# Patient Record
Sex: Female | Born: 1975 | Race: White | Hispanic: No | Marital: Married | Smoking: Never smoker
Health system: Southern US, Community
[De-identification: ages and names within clinical notes are randomized; demographics above are authoritative.]

## PROBLEM LIST (undated history)

## (undated) DIAGNOSIS — G4700 Insomnia, unspecified: Secondary | ICD-10-CM

## (undated) DIAGNOSIS — F329 Major depressive disorder, single episode, unspecified: Secondary | ICD-10-CM

## (undated) DIAGNOSIS — K2970 Gastritis, unspecified, without bleeding: Secondary | ICD-10-CM

## (undated) DIAGNOSIS — F419 Anxiety disorder, unspecified: Secondary | ICD-10-CM

## (undated) DIAGNOSIS — Z9889 Other specified postprocedural states: Secondary | ICD-10-CM

## (undated) DIAGNOSIS — F32A Depression, unspecified: Secondary | ICD-10-CM

## (undated) DIAGNOSIS — R05 Cough: Secondary | ICD-10-CM

## (undated) DIAGNOSIS — E669 Obesity, unspecified: Secondary | ICD-10-CM

## (undated) DIAGNOSIS — D126 Benign neoplasm of colon, unspecified: Secondary | ICD-10-CM

## (undated) DIAGNOSIS — T7840XA Allergy, unspecified, initial encounter: Secondary | ICD-10-CM

## (undated) DIAGNOSIS — R059 Cough, unspecified: Secondary | ICD-10-CM

## (undated) DIAGNOSIS — K219 Gastro-esophageal reflux disease without esophagitis: Secondary | ICD-10-CM

## (undated) DIAGNOSIS — L0390 Cellulitis, unspecified: Secondary | ICD-10-CM

## (undated) DIAGNOSIS — R112 Nausea with vomiting, unspecified: Secondary | ICD-10-CM

## (undated) DIAGNOSIS — Z8739 Personal history of other diseases of the musculoskeletal system and connective tissue: Secondary | ICD-10-CM

## (undated) HISTORY — DX: Anxiety disorder, unspecified: F41.9

## (undated) HISTORY — DX: Obesity, unspecified: E66.9

## (undated) HISTORY — DX: Gastro-esophageal reflux disease without esophagitis: K21.9

## (undated) HISTORY — DX: Gastritis, unspecified, without bleeding: K29.70

## (undated) HISTORY — DX: Other specified postprocedural states: Z98.890

## (undated) HISTORY — DX: Benign neoplasm of colon, unspecified: D12.6

## (undated) HISTORY — DX: Major depressive disorder, single episode, unspecified: F32.9

## (undated) HISTORY — PX: TONSILLECTOMY: SUR1361

## (undated) HISTORY — DX: Cellulitis, unspecified: L03.90

## (undated) HISTORY — DX: Other specified postprocedural states: R11.2

## (undated) HISTORY — DX: Depression, unspecified: F32.A

## (undated) HISTORY — DX: Personal history of other diseases of the musculoskeletal system and connective tissue: Z87.39

## (undated) HISTORY — DX: Insomnia, unspecified: G47.00

## (undated) HISTORY — DX: Allergy, unspecified, initial encounter: T78.40XA

## (undated) HISTORY — DX: Cough, unspecified: R05.9

## (undated) HISTORY — PX: COLONOSCOPY W/ BIOPSIES: SHX1374

---
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
## 1998-10-16 HISTORY — PX: CHOLECYSTECTOMY: SHX55

## 1999-01-16 HISTORY — PX: BREAST REDUCTION SURGERY: SHX8

## 2009-01-30 ENCOUNTER — Ambulatory Visit (HOSPITAL_COMMUNITY): Admission: RE | Admit: 2009-01-30 | Discharge: 2009-01-30 | Payer: Self-pay | Admitting: Family Medicine

## 2010-11-03 ENCOUNTER — Other Ambulatory Visit (HOSPITAL_COMMUNITY): Payer: Self-pay | Admitting: Obstetrics and Gynecology

## 2010-11-03 DIAGNOSIS — N979 Female infertility, unspecified: Secondary | ICD-10-CM

## 2010-11-13 ENCOUNTER — Ambulatory Visit (HOSPITAL_COMMUNITY)
Admission: RE | Admit: 2010-11-13 | Discharge: 2010-11-13 | Disposition: A | Discharge status: Home / Self Care | Payer: BC Managed Care – PPO | Source: Ambulatory Visit | Attending: Obstetrics and Gynecology | Admitting: Obstetrics and Gynecology

## 2010-11-13 ENCOUNTER — Ambulatory Visit (HOSPITAL_COMMUNITY): Payer: Self-pay

## 2010-11-13 DIAGNOSIS — N979 Female infertility, unspecified: Secondary | ICD-10-CM | POA: Insufficient documentation

## 2010-11-13 MED ORDER — IOHEXOL 300 MG/ML  SOLN
25.0000 mL | Freq: Once | INTRAMUSCULAR | Status: AC | PRN
  Administered 2010-11-13: 25 mL

## 2011-01-16 HISTORY — PX: COLONOSCOPY: SHX174

## 2011-01-16 HISTORY — PX: POLYPECTOMY: SHX149

## 2011-06-04 ENCOUNTER — Other Ambulatory Visit: Payer: Self-pay | Admitting: Family Medicine

## 2011-06-04 DIAGNOSIS — M545 Low back pain: Secondary | ICD-10-CM

## 2011-06-06 ENCOUNTER — Inpatient Hospital Stay: Admission: RE | Admit: 2011-06-06 | Payer: BC Managed Care – PPO | Source: Ambulatory Visit

## 2011-09-27 ENCOUNTER — Other Ambulatory Visit: Payer: Self-pay | Admitting: Family Medicine

## 2011-09-27 DIAGNOSIS — R634 Abnormal weight loss: Secondary | ICD-10-CM

## 2011-10-02 ENCOUNTER — Ambulatory Visit (HOSPITAL_COMMUNITY)
Admission: RE | Admit: 2011-10-02 | Discharge: 2011-10-02 | Disposition: A | Discharge status: Home / Self Care | Payer: BC Managed Care – PPO | Source: Ambulatory Visit | Attending: Family Medicine | Admitting: Family Medicine

## 2011-10-02 DIAGNOSIS — R197 Diarrhea, unspecified: Secondary | ICD-10-CM | POA: Insufficient documentation

## 2011-10-02 DIAGNOSIS — R935 Abnormal findings on diagnostic imaging of other abdominal regions, including retroperitoneum: Secondary | ICD-10-CM | POA: Insufficient documentation

## 2011-10-02 DIAGNOSIS — R634 Abnormal weight loss: Secondary | ICD-10-CM

## 2011-10-02 DIAGNOSIS — R110 Nausea: Secondary | ICD-10-CM | POA: Insufficient documentation

## 2011-10-02 MED ORDER — IOHEXOL 300 MG/ML  SOLN
100.0000 mL | Freq: Once | INTRAMUSCULAR | Status: AC | PRN
  Administered 2011-10-02: 100 mL via INTRAVENOUS

## 2011-10-08 ENCOUNTER — Other Ambulatory Visit: Payer: Self-pay | Admitting: Family Medicine

## 2011-10-08 DIAGNOSIS — K769 Liver disease, unspecified: Secondary | ICD-10-CM

## 2011-10-10 ENCOUNTER — Ambulatory Visit (HOSPITAL_COMMUNITY)
Admission: RE | Admit: 2011-10-10 | Discharge: 2011-10-10 | Disposition: A | Discharge status: Home / Self Care | Payer: BC Managed Care – PPO | Source: Ambulatory Visit | Attending: Family Medicine | Admitting: Family Medicine

## 2011-10-10 ENCOUNTER — Other Ambulatory Visit: Payer: Self-pay | Admitting: Family Medicine

## 2011-10-10 DIAGNOSIS — K7689 Other specified diseases of liver: Secondary | ICD-10-CM | POA: Insufficient documentation

## 2011-10-10 DIAGNOSIS — K769 Liver disease, unspecified: Secondary | ICD-10-CM

## 2011-10-10 MED ORDER — GADOBENATE DIMEGLUMINE 529 MG/ML IV SOLN
20.0000 mL | Freq: Once | INTRAVENOUS | Status: AC | PRN
  Administered 2011-10-10: 20 mL via INTRAVENOUS

## 2011-11-19 ENCOUNTER — Encounter: Payer: Self-pay | Admitting: Internal Medicine

## 2011-11-22 ENCOUNTER — Encounter: Payer: Self-pay | Admitting: Internal Medicine

## 2011-11-23 ENCOUNTER — Ambulatory Visit (INDEPENDENT_AMBULATORY_CARE_PROVIDER_SITE_OTHER): Payer: BC Managed Care – PPO | Admitting: Internal Medicine

## 2011-11-23 ENCOUNTER — Encounter: Payer: Self-pay | Admitting: Internal Medicine

## 2011-11-23 ENCOUNTER — Other Ambulatory Visit (INDEPENDENT_AMBULATORY_CARE_PROVIDER_SITE_OTHER): Payer: BC Managed Care – PPO

## 2011-11-23 VITALS — BP 118/90 | HR 112 | Ht 62.0 in | Wt 227.0 lb

## 2011-11-23 DIAGNOSIS — F419 Anxiety disorder, unspecified: Secondary | ICD-10-CM | POA: Insufficient documentation

## 2011-11-23 DIAGNOSIS — E669 Obesity, unspecified: Secondary | ICD-10-CM | POA: Insufficient documentation

## 2011-11-23 DIAGNOSIS — R634 Abnormal weight loss: Secondary | ICD-10-CM

## 2011-11-23 DIAGNOSIS — R112 Nausea with vomiting, unspecified: Secondary | ICD-10-CM

## 2011-11-23 DIAGNOSIS — R195 Other fecal abnormalities: Secondary | ICD-10-CM

## 2011-11-23 DIAGNOSIS — F329 Major depressive disorder, single episode, unspecified: Secondary | ICD-10-CM | POA: Insufficient documentation

## 2011-11-23 DIAGNOSIS — R109 Unspecified abdominal pain: Secondary | ICD-10-CM

## 2011-11-23 LAB — COMPREHENSIVE METABOLIC PANEL
ALT: 16 U/L (ref 0–35)
AST: 19 U/L (ref 0–37)
Alkaline Phosphatase: 54 U/L (ref 39–117)
BUN: 9 mg/dL (ref 6–23)
Creatinine, Ser: 0.7 mg/dL (ref 0.4–1.2)
Total Bilirubin: 0.5 mg/dL (ref 0.3–1.2)

## 2011-11-23 LAB — CBC
HCT: 45.3 % (ref 36.0–46.0)
Hemoglobin: 15 g/dL (ref 12.0–15.0)
WBC: 10.8 10*3/uL — ABNORMAL HIGH (ref 4.5–10.5)

## 2011-11-23 LAB — IGA: IgA: 228 mg/dL (ref 68–378)

## 2011-11-23 LAB — TSH: TSH: 1.71 u[IU]/mL (ref 0.35–5.50)

## 2011-11-23 MED ORDER — PEG-KCL-NACL-NASULF-NA ASC-C 100 G PO SOLR: 1.0000 | Freq: Once | ORAL | Status: DC

## 2011-11-23 NOTE — Progress Notes (Signed)
Patient ID: Ruth Rivers, female   DOB: 15-Oct-1975, 36 y.o.   MRN: [id number]  SUBJECTIVE: HPI Mrs. Plate is a 36 yo female with PMH of anxiety and depression and obesity who seen in consultation at the request of Dr. Christell Constant for evaluation of weight loss and abdominal complaints. The patient reports that she has lost 75 pounds since late May early June of this year. It was mid July before she noticed her weight loss. Within last 3 or 4 weeks she's also developed significant nausea which is worse after eating along with vomiting. She reports she is often unable to keep down her food secondary to vomiting. She has also noted diffuse abdominal pain also worse after eating. Her bowel habits have changed and she is developed loose stools. She's not having stool every day, but when she is able to keep food down she normally has loose stools. She denies blood or melena in her stool. She's had some nocturnal heartburn but no dysphagia or odynophagia. He's also noted cervical neck pain and elbow pain.  She has intermittently had rash which he describes like "hives" the last within the past week around her umbilicus which resolved with hydrocortisone cream. No known fevers or chills. No new medications other than Nexium 40 mg daily started in the last week which has not seemed to help. She's also using Phenergan and Tylenol #3 for nausea and pain respectively. She has been on venlafaxine and Klonopin when necessary for years.  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  She has a maternal and who had colon cancer around age 4 but no first degree relatives with CRC  Review of Systems  As per history of present illness, otherwise negative   Past Medical History  Diagnosis Date  . Cellulitis   . Depression   . Insomnia   . Anxiety   . Obesity     Current Outpatient Prescriptions  Medication Sig Dispense Refill  . acetaminophen-codeine (TYLENOL #3) 300-30 MG per tablet Take 1 tablet by  mouth every 4 (four) hours as needed.      . clonazePAM (KLONOPIN) 1 MG tablet Take 1 mg by mouth as needed.      Marland Kitchen esomeprazole (NEXIUM) 40 MG capsule Take 40 mg by mouth daily before breakfast.      . promethazine (PHENERGAN) 25 MG tablet Take 25 mg by mouth every 6 (six) hours as needed.      . venlafaxine XR (EFFEXOR-XR) 75 MG 24 hr capsule Take 375 mg by mouth daily.        Allergies  Allergen Reactions  . Penicillins Rash    Family History  Problem Relation Age of Onset  . Breast cancer Other     PGGM  . Colon cancer Maternal Aunt   . Colon polyps Father   . Other Cousin     Non-Hodgkins lymphoma    History  Substance Use Topics  . Smoking status: Never Smoker   . Smokeless tobacco: Never Used  . Alcohol Use: No    OBJECTIVE: BP 118/90  Pulse 112  Ht 5\' 2"  (1.575 m)  Wt 227 lb (102.967 kg)  BMI 41.52 kg/m2  LMP 11/06/2011 Constitutional: Well-developed and well-nourished. No distress. HEENT: Normocephalic and atraumatic. Oropharynx is clear and moist. No oropharyngeal exudate. Conjunctivae are normal. No scleral icterus. Cardiovascular: Normal rate, regular rhythm and intact distal pulses. No M/R/G Pulmonary/chest: Effort normal and breath sounds normal. No wheezing, rales or rhonchi. Abdominal: Soft, mild  diffuse tenderness without rebound or guarding, obese, nondistended. Bowel sounds active throughout. There are no masses palpable. No hepatosplenomegaly. Extremities: no clubbing, cyanosis, or edema Neurological: Alert and oriented to person place and time. Skin: Skin is warm and dry. No rashes noted. Psychiatric: Normal mood and affect. Behavior is normal.  Labs and Imaging -- Fecal WBC - negative C diff - negative ESR 8 H Pylori IgG - negative  MRI abd 10/10/11 -- Impression: 1. Focal fatty deposition identified within the medial segment of the left hepatic lobe corresponds to the hypodense structure noted on recent CT. 2.  Prior cholecystectomy  CT  abdomen 10/02/2011 -- impression: 1. No acute abnormality. 2. Low density lesion in the right hepatic lobe adjacent to the falciform ligament likely represents benign hemangioma. 3. Cholecystectomy  ASSESSMENT AND PLAN: 36 yo female with PMH of anxiety and depression and obesity who seen in consultation at the request of Dr. Christell Constant for evaluation of weight loss and abdominal complaints  1.  Profound weight loss/nausea/vomiting/abdominal pain -- the patient has had an impressive weight loss approximately 20% of her body weight and this is yet to be explained. She has recently developed GI complaints in this warrants further investigation. I recommended upper endoscopy and colonoscopy both to evaluate for inflammation or other causes to explain her weight loss. She did have 2 stool studies performed recently, but I would like to check an ova and parasite exam as well. I will check a celiac panel. Will plan small bowel biopsies to evaluate for a malabsorptive process. She will continue with Nexium daily for now along with Phenergan as needed for nausea.  Further recommendations can be made after endoscopies

## 2011-11-23 NOTE — Patient Instructions (Addendum)
You have been scheduled for a colonoscopy/endoscopy with propofol. Please follow written instructions given to you at your visit today.  Please pick up your prep kit at the pharmacy within the next 1-3 days. If you use inhalers (even only as needed) or a CPAP machine, please bring them with you on the day of your procedure.  Your physician has requested that you go to the basement for the following lab work before leaving today: CBC, CMP, TSH, Celiac  We have sent the following medications to your pharmacy for you to pick up at your convenience: Moviprep; you were given instructions today at your office visit

## 2011-11-27 ENCOUNTER — Telehealth: Payer: Self-pay | Admitting: Internal Medicine

## 2011-11-27 LAB — TISSUE TRANSGLUTAMINASE, IGA: Tissue Transglutaminase Ab, IgA: 8 U/mL (ref <20)

## 2011-11-27 MED ORDER — ONDANSETRON HCL 4 MG PO TABS: ORAL_TABLET | ORAL | Status: DC

## 2011-11-27 MED ORDER — HYDROCODONE-ACETAMINOPHEN 5-500 MG PO TABS: ORAL_TABLET | ORAL | Status: DC

## 2011-11-27 NOTE — Telephone Encounter (Signed)
Pt reports her nausea is worse as well as her pain in the middle of her abdomen just below the waist. She states the Phenergan isn't helping nor is the Tylenol #3; states she can't keep anything down. I cannot move up her ECL d/t no space. Will call her back.   Spoke with Mike Gip, PA who recommended Zofran for nausea prn and Vicodin for pain, but she must stop the Tylenol #3. Informed pt of med changes and that I will send this to Dr Rhea Belton and will call her back tomorrow. Pt stated understanding.

## 2011-11-27 NOTE — Telephone Encounter (Signed)
lmom for pt to call back. Pt seen on 11/23/11 for weight loss, abdominal pain, n&v. Pt is scheduled for an ECL on 12/03/11.

## 2011-11-28 ENCOUNTER — Telehealth: Payer: Self-pay | Admitting: Internal Medicine

## 2011-11-28 NOTE — Telephone Encounter (Signed)
Agree with the new meds assuming she stops Tylenol #3 Her procedure is early next week.  Please ask that she keep Ruth Rivers updated on how she is doing

## 2011-11-28 NOTE — Telephone Encounter (Signed)
Called in script again; notified pt.

## 2011-11-28 NOTE — Telephone Encounter (Signed)
Informed pt of Dr Lauro Franklin recommendations on 11/28/11 encounter.

## 2011-12-03 ENCOUNTER — Other Ambulatory Visit: Payer: BC Managed Care – PPO

## 2011-12-03 ENCOUNTER — Encounter: Payer: Self-pay | Admitting: Internal Medicine

## 2011-12-03 ENCOUNTER — Ambulatory Visit (AMBULATORY_SURGERY_CENTER): Payer: BC Managed Care – PPO | Admitting: Internal Medicine

## 2011-12-03 VITALS — BP 108/73 | HR 67 | Temp 100.2°F | Resp 14 | Ht 62.0 in | Wt 227.0 lb

## 2011-12-03 DIAGNOSIS — K635 Polyp of colon: Secondary | ICD-10-CM

## 2011-12-03 DIAGNOSIS — R634 Abnormal weight loss: Secondary | ICD-10-CM

## 2011-12-03 DIAGNOSIS — K2970 Gastritis, unspecified, without bleeding: Secondary | ICD-10-CM

## 2011-12-03 DIAGNOSIS — D126 Benign neoplasm of colon, unspecified: Secondary | ICD-10-CM

## 2011-12-03 DIAGNOSIS — R109 Unspecified abdominal pain: Secondary | ICD-10-CM

## 2011-12-03 DIAGNOSIS — R112 Nausea with vomiting, unspecified: Secondary | ICD-10-CM

## 2011-12-03 DIAGNOSIS — D1330 Benign neoplasm of unspecified part of small intestine: Secondary | ICD-10-CM

## 2011-12-03 DIAGNOSIS — K2960 Other gastritis without bleeding: Secondary | ICD-10-CM

## 2011-12-03 MED ORDER — SODIUM CHLORIDE 0.9 % IV SOLN: 500.0000 mL | INTRAVENOUS | Status: DC

## 2011-12-03 MED ORDER — SUCRALFATE 1 GM/10ML PO SUSP: 1.0000 g | Freq: Four times a day (QID) | ORAL | Status: DC

## 2011-12-03 MED ORDER — ESOMEPRAZOLE MAGNESIUM 40 MG PO CPDR: 40.0000 mg | DELAYED_RELEASE_CAPSULE | Freq: Two times a day (BID) | ORAL | Status: DC

## 2011-12-03 NOTE — Progress Notes (Signed)
Pressure applied to the abdomen to reach the cecum 

## 2011-12-03 NOTE — Progress Notes (Signed)
Patient did not experience any of the following events: a burn prior to discharge; a fall within the facility; wrong site/side/patient/procedure/implant event; or a hospital transfer or hospital admission upon discharge from the facility. (G8907) Patient did not have preoperative order for IV antibiotic SSI prophylaxis. (G8918)  

## 2011-12-03 NOTE — Op Note (Signed)
[facility] [address] [place], [address]   COLONOSCOPY PROCEDURE REPORT  PATIENT: Jacquita, Mulhearn  MR#: [id number] BIRTHDATE: 1975/05/06 , 36  yrs. old GENDER: Female ENDOSCOPIST: Beverley Fiedler, MD REFERRED XB:JYNWG, Donald PROCEDURE DATE:  12/03/2011 PROCEDURE:   Colonoscopy with snare polypectomy ASA CLASS:   Class III INDICATIONS:Weight loss, Nausea, Vomiting, and Change in bowel habits. MEDICATIONS: MAC sedation, administered by CRNA and propofol (Diprivan) 400mg  IV  DESCRIPTION OF PROCEDURE:   After the risks benefits and alternatives of the procedure were thoroughly explained, informed consent was obtained.  A digital rectal exam revealed no rectal mass.   The LB [id number]-H180AL [id number]  endoscope was introduced through the anus and advanced to the terminal ileum which was intubated for a short distance. No adverse events experienced.   The quality of the prep was good, using MoviPrep  The instrument was then slowly withdrawn as the colon was fully examined.   COLON FINDINGS: The mucosa appeared normal in the terminal ileum. A sessile polyp measuring 3 mm in size was found in the transverse colon.  A polypectomy was performed with a cold snare.  The resection was complete and the polyp tissue was completely retrieved.   A sessile polyp measuring 10 mm in size was found in the transverse colon.  A polypectomy was performed using snare cautery.  The resection was complete and the polyp tissue was completely retrieved.   Mild erythema (nonspecific) was found in the sigmoid colon.  This erythema was patchy over approximately 10 cm.  Multiple biopsies of the area were performed using cold forceps.  Retroflexed views revealed no abnormalities. The time to cecum=7 minutes 14 seconds.  Withdrawal time=22 minutes 48 seconds. The scope was withdrawn and the procedure completed.  COMPLICATIONS: There were no complications.  ENDOSCOPIC IMPRESSION: 1.   Normal  mucosa in the terminal ileum 2.   Sessile polyp measuring 3 mm in size was found in the transverse colon; polypectomy was performed with a cold snare 3.   Sessile polyp measuring 10 mm in size was found in the transverse colon; polypectomy was performed using snare cautery 4.   Mild erythematous was found in the sigmoid colon; multiple biopsies of the area were performed using cold forceps  RECOMMENDATIONS: 1.  Await pathology results 2.  Timing of repeat colonoscopy will be determined by pathology findings. 3.  You will receive a letter within 1-2 weeks with the results of your biopsy as well as final recommendations.  Please call my office if you have not received a letter after 3 weeks.   eSigned:  Beverley Fiedler, MD 12/03/2011 3:58 PM   cc: Rudi Heap, MD and The Patient   PATIENT NAME:  Frankie, Zito MR#: [id number]

## 2011-12-03 NOTE — Patient Instructions (Addendum)

## 2011-12-03 NOTE — Op Note (Signed)
[facility] [address] [place], [address]   ENDOSCOPY PROCEDURE REPORT  PATIENT: [name], [name]  MR#: [id number] BIRTHDATE: 1975/07/20 , 36  yrs. old GENDER: Female ENDOSCOPIST: Beverley Fiedler, MD REFERRED BY:  Rudi Heap PROCEDURE DATE:  12/03/2011 PROCEDURE:  EGD w/ biopsy ASA CLASS:     Class III INDICATIONS:  Nausea.   Vomiting.   Weight loss.   abdominal pain. MEDICATIONS: MAC sedation, administered by CRNA and propofol (Diprivan) 200mg  IV TOPICAL ANESTHETIC: Cetacaine Spray  DESCRIPTION OF PROCEDURE: After the risks benefits and alternatives of the procedure were thoroughly explained, informed consent was obtained.  The LB GIF-H180 [id number] endoscope was introduced through the mouth and advanced to the second portion of the duodenum. Without limitations.  The instrument was slowly withdrawn as the mucosa was fully examined.      ESOPHAGUS: There was LA Class B esophagitis noted.   The esophagus was otherwise normal.  STOMACH: Moderate acute gastritis (inflammation) with hemorrhage was found in the gastric body and gastric antrum.  Biopsies were taken in the antrum and angularis.  DUODENUM: The duodenal mucosa showed no abnormalities in the bulb and second portion of the duodenum.  Cold forceps biopsies were taken in the bulb and second portion.  Retroflexed views revealed no abnormalities.     The scope was then withdrawn from the patient and the procedure completed.  COMPLICATIONS: There were no complications.  ENDOSCOPIC IMPRESSION: 1.   There was LA Class B esophagitis noted 2.   The esophagus was otherwise normal. 3.   Acute gastritis (inflammation) with hemorrhage was found in the gastric body and gastric antrum; biopsies were taken in the antrum and angularis 4.   The duodenal mucosa showed no abnormalities in the bulb and second portion of the duodenum     RECOMMENDATIONS: 1.  Await pathology results 2.  Avoid NSAIDS 3.   Follow-up of helicobacter pylori status, treat if indicated 4.  Would increase Nexium to 40 mg twice daily until pathology results available  eSigned:  Beverley Fiedler, MD 12/03/2011 3:52 PM   XB:JYNWGN Christell Constant, MD and The Patient  PATIENT NAME:  [name], [name] MR#: [id number]

## 2011-12-04 ENCOUNTER — Telehealth: Payer: Self-pay | Admitting: *Deleted

## 2011-12-04 LAB — GASTRIN, SERUM: Gastrin: 10 pg/mL (ref 0–115)

## 2011-12-04 NOTE — Telephone Encounter (Signed)
  Follow up Call-  Call back number 12/03/2011  Post procedure Call Back phone  # [phone]  Permission to leave phone message Yes     Patient questions:  Do you have a fever, pain , or abdominal swelling? yes Pain Score  7 *  Have you tolerated food without any problems? no  Have you been able to return to your normal activities? yes  Do you have any questions about your discharge instructions: Diet   no Medications  no Follow up visit  no  Do you have questions or concerns about your Care? no  Actions: * If pain score is 4 or above: Physician/ provider Notified : Erick Blinks, MD - pt c/o ABD pain 7/10 and nausea, pt states this is not new had both pain and nausea before procedure, reviewed increase in nexium and avoiding NSAIDS, encouraged pt to call back if pain and nausea does not subside. This message will be routed to Dr. Rhea Belton

## 2011-12-04 NOTE — Telephone Encounter (Signed)
Patient's pain is ongoing, we have increased her Nexium and added sucralfate. Hopefully we'll have more formation soon when biopsy results are available

## 2011-12-06 ENCOUNTER — Other Ambulatory Visit: Payer: Self-pay | Admitting: Gastroenterology

## 2011-12-06 ENCOUNTER — Telehealth: Payer: Self-pay | Admitting: Internal Medicine

## 2011-12-06 DIAGNOSIS — R509 Fever, unspecified: Secondary | ICD-10-CM

## 2011-12-06 MED ORDER — HYDROCODONE-ACETAMINOPHEN 5-500 MG PO TABS: ORAL_TABLET | ORAL | Status: DC

## 2011-12-06 NOTE — Telephone Encounter (Signed)
Patient reports that she is having continued abdominal pain.  This is the same pain she had prior to colon/endo on 12/03/11.  She reports this am temp 102.3  she did take Tylenol and about 1 hour ago it was 100.2.  Discussed with Dr. Rhea Belton.  She should come for a UA and CXR for fever of unknown origin.  He does not believe this is procedure related.  He also ordered that the patient contact Dr. Kathi Der office and advise about fever.  She denies any other new symptoms, sick contacts, cough, congestion, etc.  She is advised we will contact her with bx results, but Dr. Rhea Belton did not think the bx results would explain fever. She verbalized understanding.

## 2011-12-07 ENCOUNTER — Ambulatory Visit (INDEPENDENT_AMBULATORY_CARE_PROVIDER_SITE_OTHER)
Admission: RE | Admit: 2011-12-07 | Discharge: 2011-12-07 | Disposition: A | Discharge status: Home / Self Care | Payer: BC Managed Care – PPO | Source: Ambulatory Visit | Attending: Internal Medicine | Admitting: Internal Medicine

## 2011-12-07 ENCOUNTER — Other Ambulatory Visit (INDEPENDENT_AMBULATORY_CARE_PROVIDER_SITE_OTHER): Payer: BC Managed Care – PPO

## 2011-12-07 DIAGNOSIS — R509 Fever, unspecified: Secondary | ICD-10-CM

## 2011-12-07 LAB — URINALYSIS, ROUTINE W REFLEX MICROSCOPIC
Bilirubin Urine: NEGATIVE
Hgb urine dipstick: NEGATIVE
Leukocytes, UA: NEGATIVE
Nitrite: NEGATIVE

## 2011-12-12 ENCOUNTER — Encounter: Payer: Self-pay | Admitting: Internal Medicine

## 2011-12-12 ENCOUNTER — Telehealth: Payer: Self-pay | Admitting: *Deleted

## 2011-12-12 NOTE — Telephone Encounter (Signed)
Informed pt of Dr Lauro Franklin findings and recommendations that she discuss this further with Dr Christell Constant and return here for an OV on 01/02/12.

## 2011-12-12 NOTE — Telephone Encounter (Signed)
Message copied by Florene Glen on Wed Dec 12, 2011  2:36 PM ------      Message from: Beverley Fiedler      Created: Wed Dec 12, 2011  1:19 PM       Small bowel biopsies were normal      Gastric biopsies revealed minimal chronic, not active, inflammation with NO H. Pylori      2 colon polyps were adenomatous, therefore needs repeat colonoscopy in 3 years      Mild redness in the left colon was nonspecific, minimal, and possibly related to prep.  No evidence for IBD      --Still no great explanation for weight loss; I recommend that she discuss this further with her primary care and return to see me in office visit

## 2011-12-31 ENCOUNTER — Encounter: Payer: Self-pay | Admitting: Internal Medicine

## 2012-01-02 ENCOUNTER — Ambulatory Visit (INDEPENDENT_AMBULATORY_CARE_PROVIDER_SITE_OTHER): Payer: BC Managed Care – PPO | Admitting: Internal Medicine

## 2012-01-02 ENCOUNTER — Encounter: Payer: Self-pay | Admitting: Internal Medicine

## 2012-01-02 VITALS — BP 110/78 | HR 80 | Ht 62.0 in | Wt 239.8 lb

## 2012-01-02 DIAGNOSIS — K209 Esophagitis, unspecified without bleeding: Secondary | ICD-10-CM

## 2012-01-02 DIAGNOSIS — Z860100 Personal history of colon polyps, unspecified: Secondary | ICD-10-CM

## 2012-01-02 DIAGNOSIS — K2990 Gastroduodenitis, unspecified, without bleeding: Secondary | ICD-10-CM

## 2012-01-02 DIAGNOSIS — K2970 Gastritis, unspecified, without bleeding: Secondary | ICD-10-CM

## 2012-01-02 NOTE — Patient Instructions (Addendum)
Start Withdrawing Carafate, and if feeling better decrease Nexium to once a day.  Follow up with Dr. Rhea Belton in 3 months.

## 2012-01-02 NOTE — Progress Notes (Signed)
  Subjective:    Patient ID: Ruth Rivers, female    DOB: 19-Sep-1975, 36 y.o.   MRN: [id number]  HPI Ruth Rivers is a 36 yo female with PMH of anxiety and depression and obesity who seen in followup.   She was initially seen in evaluation of weight loss, nausea and abdominal pain.  She had an extensive workup including laboratory studies and imaging all of which were unremarkable. She came for upper endoscopy and colonoscopy on 12/03/2011. EGD revealed class B. Esophagitis, moderate gastritis, normal duodenum. Colonoscopy revealed 2 sessile polyps and mild erythema in the sigmoid. Pathology results revealed normal small bowel mucosa, gastritis without H. pylori or metaplasia, and sessile serrated adenomas in the colon. Mild erythema in the sigmoid colon was benign without evidence of chronic inflammation.  She was started on twice daily Nexium and sucralfate therapy.  She returns today alone, stating that she is much much improved. She reports her energy levels are back. Her nausea is resolved entirely and she's had no abdominal pain. Her diarrhea is also significantly improved and now only occurring one or 2 days per week. Otherwise she is having regular formed brown stool. No melena or rectal bleeding.  She has remained out of work, but plans to return in January 2014. 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  Review of Systems As per history of present illness, otherwise negative  Current Medications, Allergies, Past Medical History, Past Surgical History, Family History and Social History were reviewed in [employer] record.     Objective:   Physical Exam BP 110/78  Pulse 80  Ht 5\' 2"  (1.575 m)  Wt 239 lb 12.8 oz (108.773 kg)  BMI 43.86 kg/m2  LMP 12/30/2011 Constitutional: Well-developed and  well-nourished. No distress. HEENT: Normocephalic and atraumatic.  Conjunctivae a No scleral icterus. Cardiovascular: Normal rate, regular rhythm and intact distal pulses. No M/R/Gre normal.  Pulmonary/chest: Effort normal and breath sounds normal. No wheezing, rales or rhonchi. Abdominal: Soft, nontender, nondistended. Bowel sounds active throughout. There are no masses palpable. No hepatosplenomegaly. Extremities: no clubbing, cyanosis, or edema Neurological: Alert and oriented to person place and time. Skin: Skin is warm and dry. No rashes noted. Psychiatric: Normal mood and affect. Behavior is normal.      Assessment & Plan:  36 yo female with PMH of anxiety and depression and obesity who seen in followup.  1.  Esophagitis/gastritis -- her abdominal symptoms have improved dramatically with twice a day PPI therapy and Carafate. At this point she feels 100% better, and I recommended trying to discontinue Carafate therapy. If she is successful at doing so, I will also like her to decrease her PPI to once daily. If any of her symptoms return, she is asked to notify us. She voices understanding.  2.  Adenomatous colon polyps -- we have discussed her diagnosis of adenomatous colon polyps, which is at quite early age.  Based on the removal of a 1 cm sessile serrated adenoma, I recommended repeat surveillance colonoscopy in 3 years. She is aware of this recommendation.  I will see her back in 3 months time to ensure that she continues to do well

## 2012-01-08 ENCOUNTER — Telehealth: Payer: Self-pay | Admitting: Internal Medicine

## 2012-01-08 MED ORDER — PROMETHAZINE HCL 25 MG PO TABS: 25.0000 mg | ORAL_TABLET | Freq: Four times a day (QID) | ORAL | Status: DC | PRN

## 2012-01-08 NOTE — Telephone Encounter (Signed)
Pt states she is having problems with nausea, vomiting, and diarrhea. She is out of town and is not feeling good. Pt is taking Nexium BID and Carafate QID. Pt has had Zofran and Phenergan for nausea in the past. Spoke with Mike Gip PA and pt may have another prescription of Phenergan, take Imodium OTC for the diarrhea. Rx sent to [facility] in [place] and pt aware.

## 2012-02-27 ENCOUNTER — Ambulatory Visit (HOSPITAL_COMMUNITY): Payer: BC Managed Care – PPO | Admitting: Psychology

## 2012-03-04 ENCOUNTER — Ambulatory Visit (INDEPENDENT_AMBULATORY_CARE_PROVIDER_SITE_OTHER): Payer: BC Managed Care – PPO | Admitting: Psychology

## 2012-03-04 DIAGNOSIS — F5105 Insomnia due to other mental disorder: Secondary | ICD-10-CM

## 2012-03-04 DIAGNOSIS — F322 Major depressive disorder, single episode, severe without psychotic features: Secondary | ICD-10-CM

## 2012-03-04 DIAGNOSIS — F411 Generalized anxiety disorder: Secondary | ICD-10-CM

## 2012-03-04 DIAGNOSIS — F341 Dysthymic disorder: Secondary | ICD-10-CM

## 2012-03-18 ENCOUNTER — Encounter (HOSPITAL_COMMUNITY): Payer: Self-pay | Admitting: Psychology

## 2012-03-18 ENCOUNTER — Ambulatory Visit (HOSPITAL_COMMUNITY): Payer: Self-pay | Admitting: Psychology
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
## 2012-05-09 ENCOUNTER — Other Ambulatory Visit: Payer: Self-pay

## 2012-05-09 MED ORDER — ZOLPIDEM TARTRATE 10 MG PO TABS: 10.0000 mg | ORAL_TABLET | Freq: Every evening | ORAL | Status: DC | PRN

## 2012-05-09 NOTE — Telephone Encounter (Signed)
Please call in [employer]

## 2012-05-09 NOTE — Telephone Encounter (Signed)
Called in.

## 2012-05-09 NOTE — Telephone Encounter (Signed)
Last written 03/29/12  Last seen 02/20/12   If approved have nurse call in and notify patient

## 2012-05-13 ENCOUNTER — Other Ambulatory Visit: Payer: Self-pay | Admitting: Nurse Practitioner

## 2012-05-14 NOTE — Telephone Encounter (Signed)
[facility] RECALL [facility]. SEE MMM NOTE FROM 4/25. [facility]

## 2012-05-14 NOTE — Telephone Encounter (Signed)
[facility] [place] [facility]14/12 at 11:46

## 2012-06-04 ENCOUNTER — Telehealth: Payer: Self-pay | Admitting: Physician Assistant
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
## 2012-06-30 ENCOUNTER — Other Ambulatory Visit: Payer: Self-pay | Admitting: Nurse Practitioner

## 2012-07-02 NOTE — Telephone Encounter (Signed)
Please call in ambien rx with 1 refill 

## 2012-07-02 NOTE — Telephone Encounter (Signed)
Last filled 05/14/12, last seen 02/20/12. [facility] appproved [facility]

## 2012-07-02 NOTE — Telephone Encounter (Signed)
Rx called in 

## 2012-07-03 ENCOUNTER — Other Ambulatory Visit: Payer: Self-pay | Admitting: Nurse Practitioner

## 2012-08-29 ENCOUNTER — Ambulatory Visit: Payer: Self-pay | Admitting: Family Medicine

## 2012-09-03 ENCOUNTER — Telehealth: Payer: Self-pay | Admitting: Family Medicine

## 2012-09-06 ENCOUNTER — Ambulatory Visit (INDEPENDENT_AMBULATORY_CARE_PROVIDER_SITE_OTHER): Payer: BC Managed Care – PPO | Admitting: Family Medicine

## 2012-09-06 ENCOUNTER — Encounter: Payer: Self-pay | Admitting: Family Medicine

## 2012-09-06 VITALS — BP 103/67 | HR 62 | Temp 97.1°F | Ht 62.0 in | Wt 248.0 lb

## 2012-09-06 DIAGNOSIS — H6690 Otitis media, unspecified, unspecified ear: Secondary | ICD-10-CM

## 2012-09-06 DIAGNOSIS — H6691 Otitis media, unspecified, right ear: Secondary | ICD-10-CM

## 2012-09-06 MED ORDER — LEVOFLOXACIN 500 MG PO TABS: 500.0000 mg | ORAL_TABLET | Freq: Every day | ORAL | Status: DC

## 2012-09-06 NOTE — Patient Instructions (Signed)
Jaw  Range of Motion Exercises  Do the exercises below to prevent problems opening your mouth after a jaw fracture, TMJ, or surgery.  HOME CARE  Warm up  · Open your mouth as wide as possible for 15 seconds.  · Then, close your mouth and rest. Repeat this 8 times.  Exercises  · Stick your jaw forward for 15 seconds. Rest your jaw. Repeat this 8 times.  · Move your jaw right for 15 seconds. Rest your jaw and repeat 8 times.  · Move your jaw left for 15 seconds. Rest your jaw and repeat 8 times.  · Put your middle finger and thumb in your mouth. Push with your thumb on your top teeth and your middle finger on your bottom teeth opening your mouth.  · Stretch your mouth open as far as possible. Repeat 8 times.  · Chew gum when you are not doing exercises.  · Use moist heat packs 3 to 4 times a day on your jaw area.  Document Released: 12/15/2007 Document Revised: 03/26/2011 Document Reviewed: 12/15/2007  ExitCare® Patient Information ©2014 [employer].

## 2012-09-06 NOTE — Progress Notes (Signed)
  Subjective:    Patient ID: Ruth Rivers, female    DOB: 02-Jul-1975, 37 y.o.   MRN: [id number]  HPI This 37 y.o. female presents for evaluation of ROM.  She was tx recently for ROM and she states she  Is not having any pain but is still having muffled sound and feels like she has drainage in he middle ear.   Review of Systems C/o right ear infection    No chest pain, SOB, HA, dizziness, vision change, N/V, diarrhea, constipation, dysuria, urinary urgency or frequency, myalgias, arthralgias or rash.  Objective:   Physical Exam  Vital signs noted  Well developed well nourished female.  HEENT - Head atraumatic Normocephalic                Eyes - PERRLA, Conjuctiva - clear Sclera- Clear EOMI                Ears - AD - Injected TM with effusion, Left EAC and TM normal                Nose - Nares patent                 Throat - oropharanx wnl Respiratory - Lungs CTA bilateral Cardiac - RRR S1 and S2 without murmur       Assessment & Plan:  ROM (right otitis media) - Plan: levofloxacin (LEVAQUIN) 500 MG tablet Decongestants otc prn as directed.

## 2012-09-15 ENCOUNTER — Other Ambulatory Visit: Payer: Self-pay | Admitting: Nurse Practitioner

## 2012-09-18 NOTE — Telephone Encounter (Signed)
Last seen 09/06/12  [facility]   If approved route to nurse to call in

## 2012-09-19 ENCOUNTER — Other Ambulatory Visit: Payer: Self-pay | Admitting: Nurse Practitioner

## 2012-09-23 ENCOUNTER — Ambulatory Visit (INDEPENDENT_AMBULATORY_CARE_PROVIDER_SITE_OTHER): Payer: BC Managed Care – PPO | Admitting: Family Medicine

## 2012-09-23 ENCOUNTER — Encounter: Payer: Self-pay | Admitting: Family Medicine

## 2012-09-23 VITALS — BP 94/66 | HR 73 | Temp 98.5°F | Wt 252.0 lb

## 2012-09-23 DIAGNOSIS — H6691 Otitis media, unspecified, right ear: Secondary | ICD-10-CM

## 2012-09-23 DIAGNOSIS — H6690 Otitis media, unspecified, unspecified ear: Secondary | ICD-10-CM

## 2012-09-23 DIAGNOSIS — B379 Candidiasis, unspecified: Secondary | ICD-10-CM

## 2012-09-23 MED ORDER — FLUCONAZOLE 150 MG PO TABS: 150.0000 mg | ORAL_TABLET | Freq: Once | ORAL | Status: DC

## 2012-09-23 MED ORDER — LEVOFLOXACIN 500 MG PO TABS: 500.0000 mg | ORAL_TABLET | Freq: Every day | ORAL | Status: DC

## 2012-09-23 NOTE — Patient Instructions (Signed)
Otitis Media with Effusion Otitis media with effusion is the presence of fluid in the middle ear. This is a common problem that often follows ear infections. It may be present for weeks or longer after the infection. Unlike an acute ear infection, otits media with effusion refers only to fluid behind the ear drum and not infection. Children with repeated ear and sinus infections and allergy problems are the most likely to get otitis media with effusion. CAUSES  The most frequent cause of the fluid buildup is dysfunction of the eustacian tubes. These are the tubes that drain fluid in the ears to the throat. SYMPTOMS   The main symptom of this condition is hearing loss. As a result, you or your child may:  Listen to the TV at a loud volume.  Not respond to questions.  Ask "what" often when spoken to.  There may be a sensation of fullness or pressure but usually not pain. DIAGNOSIS   Your caregiver will diagnose this condition by examining you or your child's ears.  Your caregiver may test the pressure in you or your child's ear with a tympanometer.  A hearing test may be conducted if the problem persists.  A caregiver will want to re-evaluate the condition periodically to see if it improves. TREATMENT   Treatment depends on the duration and the effects of the effusion.  Antibiotics, decongestants, nose drops, and cortisone-type drugs may not be helpful.  Children with persistent ear effusions may have delayed language. Children at risk for developmental delays in hearing, learning, and speech may require referral to a specialist earlier than children not at risk.  You or your child's caregiver may suggest a referral to an Ear, Nose, and Throat (ENT) surgeon for treatment. The following may help restore normal hearing:  Drainage of fluid.  Placement of ear tubes (tympanostomy tubes).  Removal of adenoids (adenoidectomy). HOME CARE INSTRUCTIONS   Avoid second hand  smoke.  Infants who are breast fed are less likely to have this condition.  Avoid feeding infants while laying flat.  Avoid known environmental allergens.  Be sure to see a caregiver or an ENT specialist for follow up.  Avoid people who are sick. SEEK MEDICAL CARE IF:   Hearing is not better in 3 months.  Hearing is worse.  Ear pain.  Drainage from the ear.  Dizziness. Document Released: 02/09/2004 Document Revised: 03/26/2011 Document Reviewed: 05/24/2009 ExitCare Patient Information 2014 [employer].  

## 2012-09-23 NOTE — Telephone Encounter (Signed)
[facility]

## 2012-09-23 NOTE — Progress Notes (Signed)
  Subjective:    Patient ID: Ruth Rivers, female    DOB: [birth year], 37 y.o.   MRN: [id number]  HPI  This 37 y.o. female presents for evaluation of right ear discomfort.  She has ben dx with Right otitis media.  She states she is not any better and cannot hear out of her right ear.  Review of Systems C/o decreased hearing acuity and right otalgia. No chest pain, SOB, HA, dizziness, vision change, N/V, diarrhea, constipation, dysuria, urinary urgency or frequency, myalgias, arthralgias or rash.     Objective:   Physical Exam  Vital signs noted  Well developed well nourished female.  HEENT - Head atraumatic Normocephalic                Eyes - PERRLA, Conjuctiva - clear Sclera- Clear EOMI                Ears - AD -TM erythematous and with fluid level.  AS TM normal                Nose - Nares patent                 Throat - oropharanx wnl Respiratory - Lungs CTA bilateral Cardiac - RRR S1 and S2 without murmur.      Assessment & Plan:  ROM (right otitis media) - Plan: levofloxacin (LEVAQUIN) 500 MG tablet, Ambulatory referral to ENT   Yeast infection - Plan: fluconazole (DIFLUCAN) 150 MG tablet

## 2012-10-20 ENCOUNTER — Telehealth: Payer: Self-pay | Admitting: Nurse Practitioner

## 2012-10-20 NOTE — Telephone Encounter (Signed)
I have not seen patient in awhile- saw Annette Stable [facility] last- ntbs

## 2012-10-20 NOTE — Telephone Encounter (Signed)
Does pt need to be seen or can you send in an rx?

## 2012-10-20 NOTE — Telephone Encounter (Signed)
Patient aware on vm

## 2012-10-22 ENCOUNTER — Telehealth: Payer: Self-pay | Admitting: Nurse Practitioner

## 2012-10-22 NOTE — Telephone Encounter (Signed)
Patient has a migraine went to urgent care yesterday and they gave her a couple shots it helped but it is back today what else can be done

## 2012-10-23 NOTE — Telephone Encounter (Signed)
PT NOTIFIED AND VERBALIZED UNDERSTANDING. SAID SHE COULDN'T COME IN TODAY. ADVISED TO GO TO ER IF NOT BETTER OR GETS WORSE.

## 2012-10-23 NOTE — Telephone Encounter (Signed)
NTBS.

## 2012-11-18 ENCOUNTER — Ambulatory Visit (INDEPENDENT_AMBULATORY_CARE_PROVIDER_SITE_OTHER): Payer: BC Managed Care – PPO | Admitting: Family Medicine

## 2012-11-18 ENCOUNTER — Encounter: Payer: Self-pay | Admitting: Family Medicine

## 2012-11-18 VITALS — BP 105/70 | HR 72 | Temp 98.2°F | Ht 62.0 in | Wt 251.2 lb

## 2012-11-18 DIAGNOSIS — Z0000 Encounter for general adult medical examination without abnormal findings: Secondary | ICD-10-CM

## 2012-11-18 DIAGNOSIS — G4700 Insomnia, unspecified: Secondary | ICD-10-CM

## 2012-11-18 LAB — POCT CBC
Granulocyte percent: 79.7 %G (ref 37–80)
HCT, POC: 39.8 % (ref 37.7–47.9)
Hemoglobin: 13.7 g/dL (ref 12.2–16.2)
Lymph, poc: 1.7 (ref 0.6–3.4)
MCH, POC: 30.4 pg (ref 27–31.2)
MCHC: 34.6 g/dL (ref 31.8–35.4)
MCV: 88.1 fL (ref 80–97)
MPV: 7.9 fL (ref 0–99.8)
POC Granulocyte: 7.9 — AB (ref 2–6.9)
POC LYMPH PERCENT: 17.5 %L (ref 10–50)
Platelet Count, POC: 296 10*3/uL (ref 142–424)
RBC: 4.5 M/uL (ref 4.04–5.48)
RDW, POC: 12.9 %
WBC: 9.9 10*3/uL (ref 4.6–10.2)

## 2012-11-18 NOTE — Progress Notes (Signed)
  Subjective:    Patient ID: Ruth Rivers, female    DOB: 1975/08/29, 37 y.o.   MRN: [id number]  HPI This 37 y.o. female presents for evaluation of insomia. She has hx of insomia and has to take ambient 2 -3 times A week.  She cannot sleep because she has racing thoughts And she has difficulty turning her mind off.  She is due for Annual labs..   Review of Systems No chest pain, SOB, HA, dizziness, vision change, N/V, diarrhea, constipation, dysuria, urinary urgency or frequency, myalgias, arthralgias or rash.     Objective:   Physical Exam Vital signs noted  Well developed well nourished female.  HEENT - Head atraumatic Normocephalic                Eyes - PERRLA, Conjuctiva - clear Sclera- Clear EOMI                Ears - EAC's Wnl TM's Wnl Gross Hearing WNL                Nose - Nares patent                 Throat - oropharanx wnl Respiratory - Lungs CTA bilateral Cardiac - RRR S1 and S2 without murmur GI - Abdomen soft Nontender and bowel sounds active x 4 Extremities - No edema. Neuro - Grossly intact.       Assessment & Plan:  Routine general medical examination at a health care facility - Plan: Lipid panel, POCT CBC, CMP14+EGFR, Thyroid Panel With TSH, Vit D  25 hydroxy (rtn osteoporosis monitoring)  Insomnia - Patient is getting insomnia medication trazadone from psychiatry and recommend she get the [facility] from psychiatry.  Deatra Canter FNP

## 2012-11-18 NOTE — Patient Instructions (Signed)

## 2012-11-19 LAB — LIPID PANEL
Chol/HDL Ratio: 3.7 ratio units (ref 0.0–4.4)
Cholesterol, Total: 171 mg/dL (ref 100–199)
HDL: 46 mg/dL (ref >39)
LDL Calculated: 87 mg/dL (ref 0–99)
Triglycerides: 188 mg/dL — ABNORMAL HIGH (ref 0–149)
VLDL Cholesterol Cal: 38 mg/dL (ref 5–40)

## 2012-11-19 LAB — CMP14+EGFR
ALT: 27 IU/L (ref 0–32)
AST: 12 IU/L (ref 0–40)
Albumin/Globulin Ratio: 1.6 (ref 1.1–2.5)
Albumin: 4.2 g/dL (ref 3.5–5.5)
Alkaline Phosphatase: 65 IU/L (ref 39–117)
BUN/Creatinine Ratio: 18 (ref 8–20)
BUN: 12 mg/dL (ref 6–20)
CO2: 26 mmol/L (ref 18–29)
Calcium: 8.9 mg/dL (ref 8.7–10.2)
Chloride: 101 mmol/L (ref 97–108)
Creatinine, Ser: 0.68 mg/dL (ref 0.57–1.00)
GFR calc Af Amer: 127 mL/min/{1.73_m2} (ref >59)
GFR calc non Af Amer: 111 mL/min/{1.73_m2} (ref >59)
Globulin, Total: 2.6 g/dL (ref 1.5–4.5)
Glucose: 77 mg/dL (ref 65–99)
Potassium: 4.4 mmol/L (ref 3.5–5.2)
Sodium: 140 mmol/L (ref 134–144)
Total Bilirubin: <0.2 mg/dL (ref 0.0–1.2)
Total Protein: 6.8 g/dL (ref 6.0–8.5)

## 2012-11-19 LAB — THYROID PANEL WITH TSH
Free Thyroxine Index: 1.7 (ref 1.2–4.9)
T3 Uptake Ratio: 27 % (ref 24–39)
T4, Total: 6.4 ug/dL (ref 4.5–12.0)
TSH: 2.15 u[IU]/mL (ref 0.450–4.500)

## 2012-11-19 LAB — VITAMIN D 25 HYDROXY (VIT D DEFICIENCY, FRACTURES): Vit D, 25-Hydroxy: 22.5 ng/mL — ABNORMAL LOW (ref 30.0–100.0)

## 2012-11-20 ENCOUNTER — Other Ambulatory Visit: Payer: Self-pay

## 2012-11-21 ENCOUNTER — Other Ambulatory Visit: Payer: Self-pay | Admitting: Family Medicine

## 2012-11-21 MED ORDER — VITAMIN D (ERGOCALCIFEROL) 1.25 MG (50000 UNIT) PO CAPS: 50000.0000 [IU] | ORAL_CAPSULE | ORAL | Status: DC

## 2013-01-23 ENCOUNTER — Telehealth: Payer: Self-pay | Admitting: Nurse Practitioner

## 2013-01-23 NOTE — Telephone Encounter (Signed)
CAN YOU REVIEW. I DONT SEE WHERE PT SEES YOU BUT SEES BILL. PLEASE FORWARD TO HIM IF YOU WOULD RATHER HIM REVIEW.

## 2013-01-23 NOTE — Telephone Encounter (Signed)
ntbs IF [place]

## 2013-02-16 ENCOUNTER — Other Ambulatory Visit: Payer: Self-pay | Admitting: Family Medicine

## 2013-02-17 NOTE — Telephone Encounter (Signed)
Last Vit D level 11/18/12  [facility]  22.5. low

## 2013-05-05 ENCOUNTER — Ambulatory Visit (INDEPENDENT_AMBULATORY_CARE_PROVIDER_SITE_OTHER): Payer: BC Managed Care – PPO | Admitting: Family Medicine

## 2013-05-05 ENCOUNTER — Encounter: Payer: Self-pay | Admitting: Family Medicine

## 2013-05-05 VITALS — BP 93/60 | HR 73 | Temp 98.2°F | Ht 62.0 in | Wt 247.4 lb

## 2013-05-05 DIAGNOSIS — M549 Dorsalgia, unspecified: Secondary | ICD-10-CM

## 2013-05-05 DIAGNOSIS — J069 Acute upper respiratory infection, unspecified: Secondary | ICD-10-CM

## 2013-05-05 MED ORDER — CYCLOBENZAPRINE HCL 10 MG PO TABS: 10.0000 mg | ORAL_TABLET | Freq: Three times a day (TID) | ORAL | Status: DC | PRN

## 2013-05-05 MED ORDER — AZITHROMYCIN 250 MG PO TABS: ORAL_TABLET | ORAL | Status: DC

## 2013-05-05 NOTE — Progress Notes (Signed)
   Subjective:    Patient ID: Ruth Rivers, female    DOB: [birth year], 38 y.o.   MRN: [id number]  HPI This 38 y.o. female presents for evaluation of URI sx's and low back pain.   Review of Systems No chest pain, SOB, HA, dizziness, vision change, N/V, diarrhea, constipation, dysuria, urinary urgency or frequency, myalgias, arthralgias or rash.     Objective:   Physical Exam Vital signs noted  Well developed well nourished female.  HEENT - Head atraumatic Normocephalic                Eyes - PERRLA, Conjuctiva - clear Sclera- Clear EOMI                Ears - EAC's Wnl TM's Wnl Gross Hearing WNL                Nose - Nares patent                 Throat - oropharanx wnl Respiratory - Lungs CTA bilateral Cardiac - RRR S1 and S2 without murmur GI - Abdomen soft Nontender and bowel sounds active x 4 Extremities - No edema. Neuro - Grossly intact. MS - TTP LS spine      Assessment & Plan:  Back pain - Plan: cyclobenzaprine (FLEXERIL) 10 MG tablet  URI (upper respiratory infection) - Plan: azithromycin (ZITHROMAX) 250 MG tablet  Push po fluids, rest, tylenol and motrin otc prn as directed for fever, arthralgias, and myalgias.  Follow up prn if sx's continue or persist.  Lysbeth Penner FNP

## 2013-07-09 IMAGING — CR DG CHEST 2V
2 series · 2 of 2 positions shown · non-contrast
Comparison: Most recent prior chest x-ray 11/04/2011

CLINICAL DATA: Fever 4 days status post colonoscopy/endoscopy

CHEST - 2 VIEW

[view not recorded (1 of 2)]
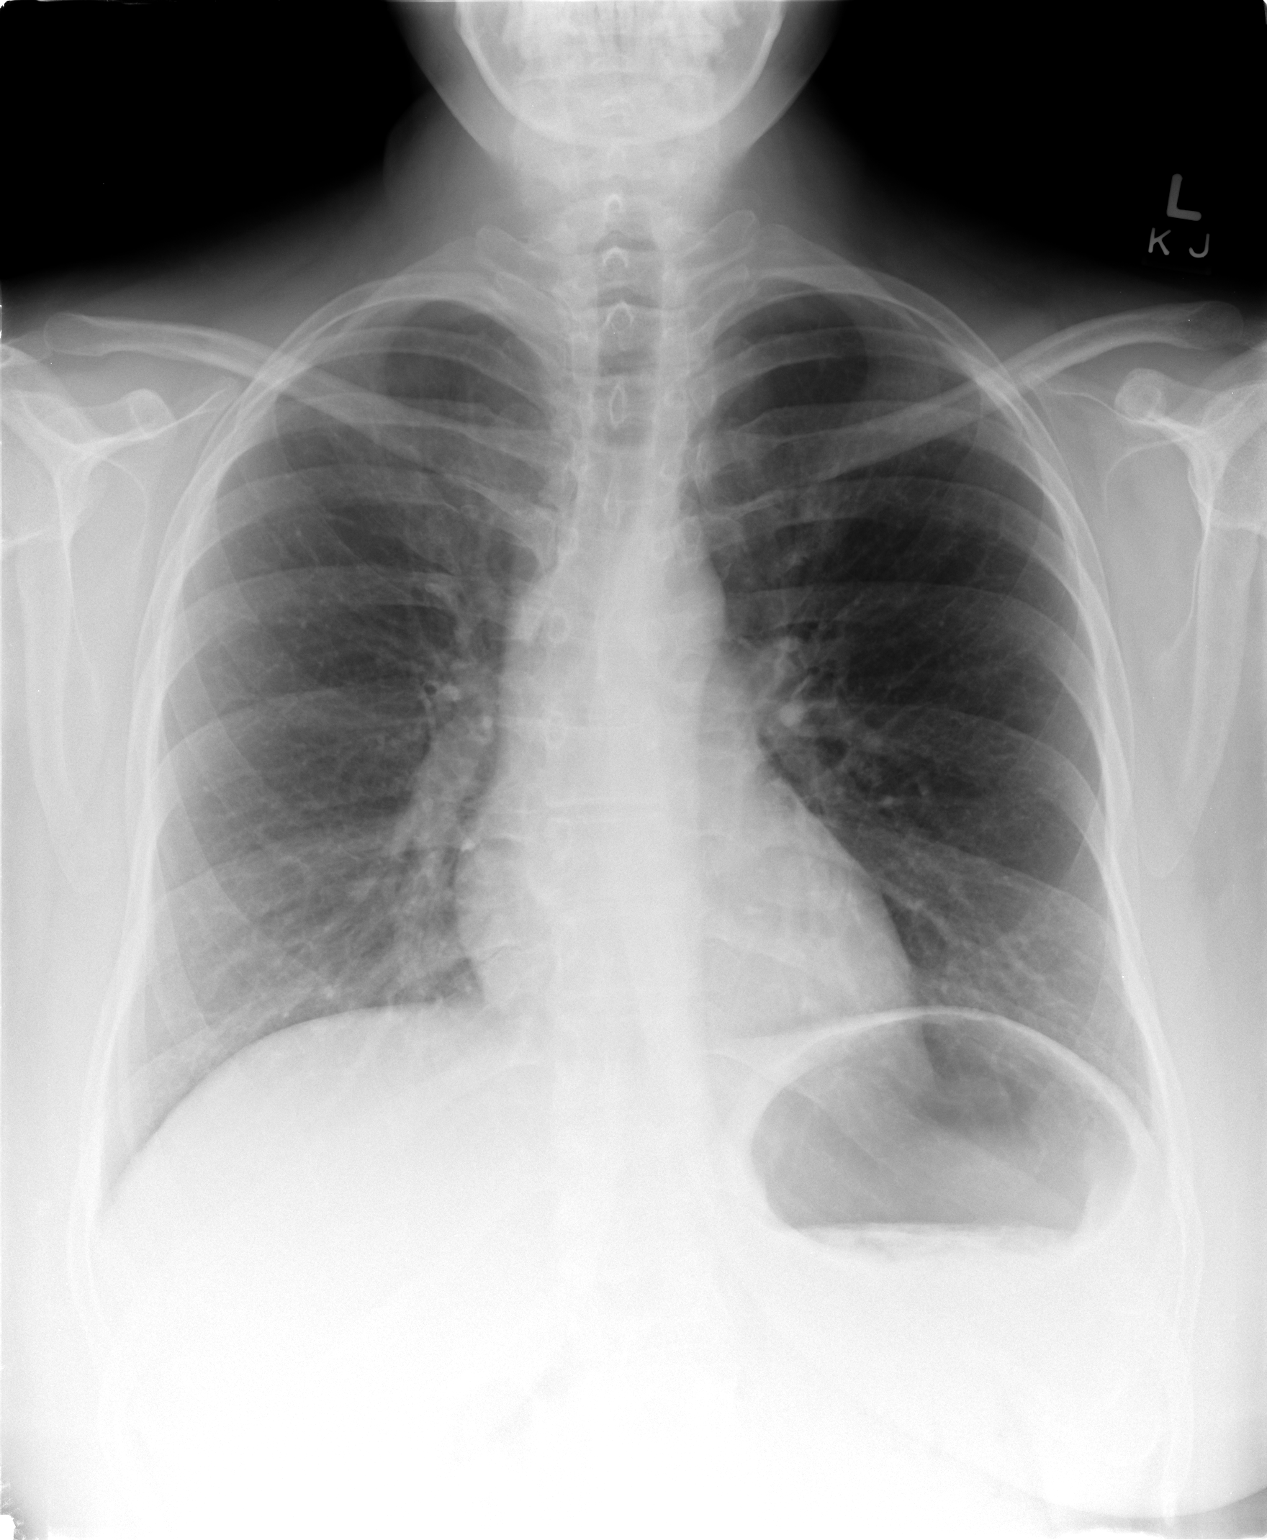

[view not recorded (2 of 2)]
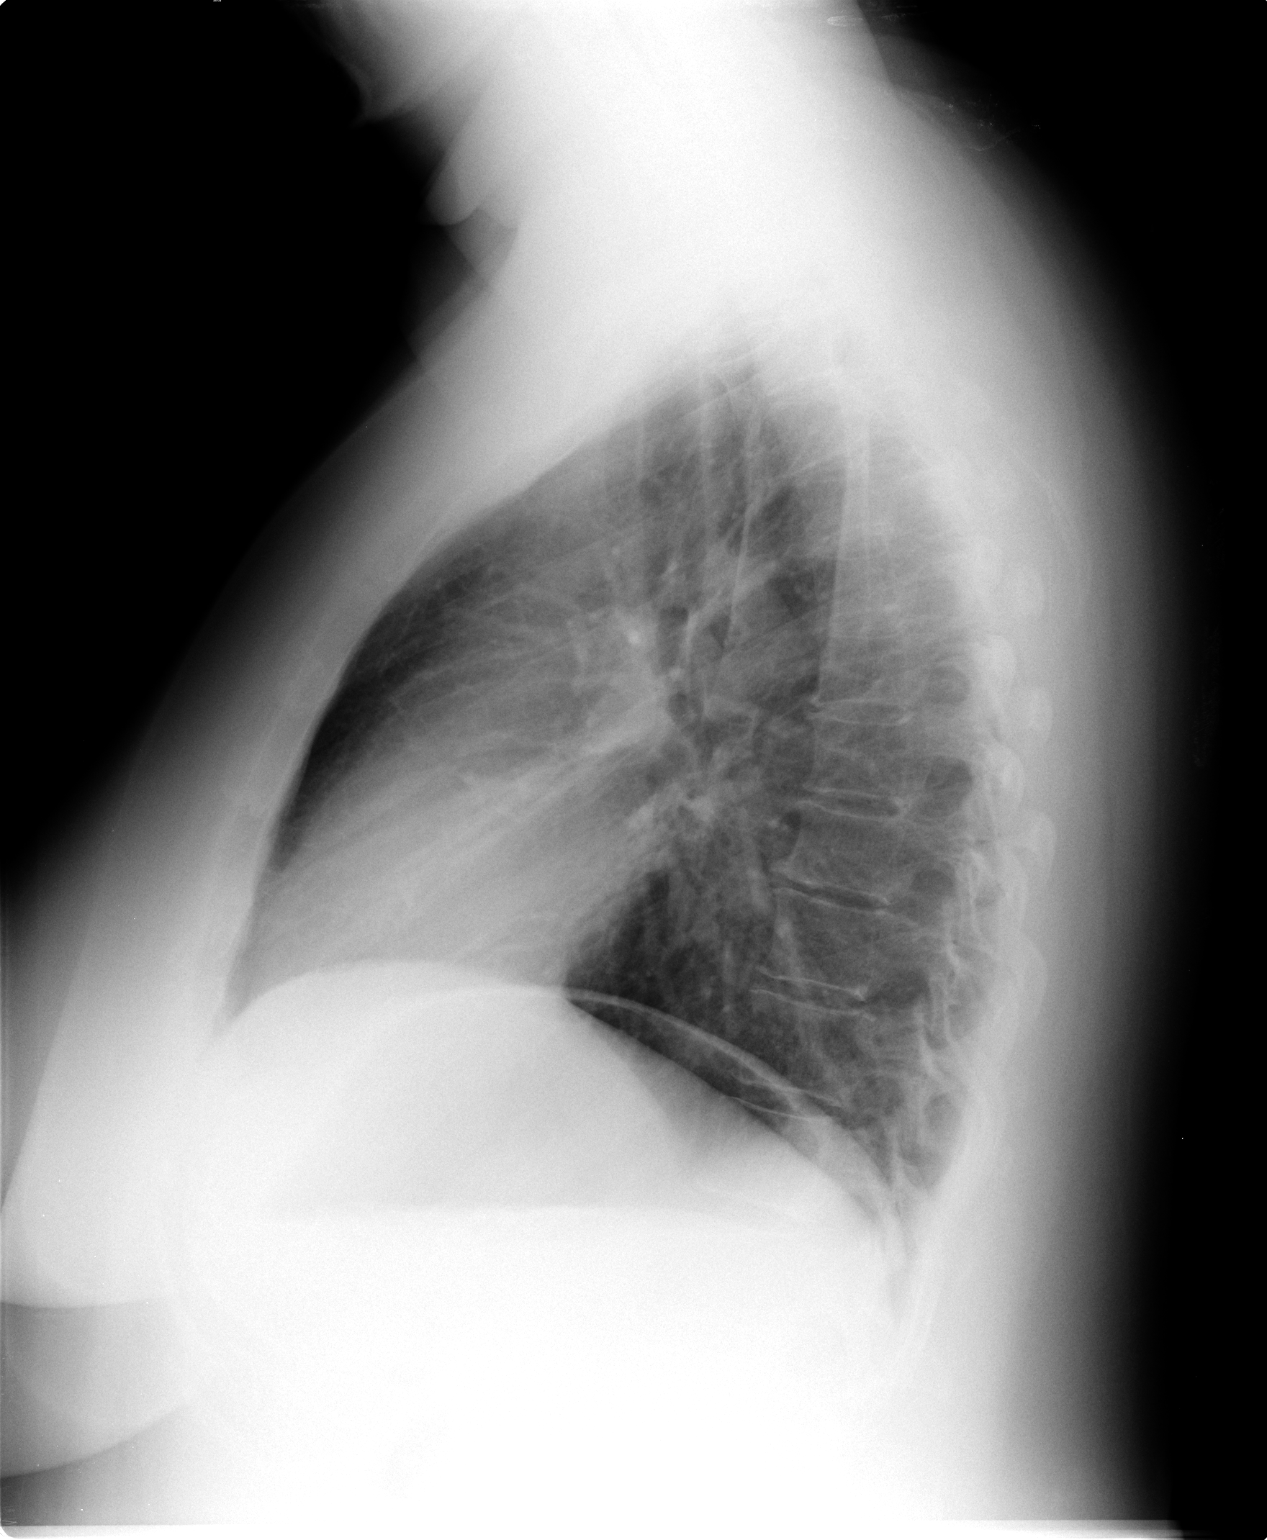

[2 of 2 positions shown; findings below may reference images not displayed]

FINDINGS: [The lungs are well-aerated and free from pulmonary
edema, focal airspace consolidation or pulmonary nodule.  Cardiac
and mediastinal contours are within normal limits.  No
pneumothorax, or pleural effusion. No acute osseous findings.]
Surgical clips in the right upper quadrant suggest prior
cholecystectomy.
IMPRESSION: No acute cardiopulmonary disease.

## 2013-12-21 ENCOUNTER — Encounter: Payer: Self-pay | Admitting: Family Medicine

## 2013-12-21 ENCOUNTER — Ambulatory Visit (INDEPENDENT_AMBULATORY_CARE_PROVIDER_SITE_OTHER): Payer: BC Managed Care – PPO | Admitting: Family Medicine

## 2013-12-21 VITALS — BP 112/76 | HR 71 | Temp 97.5°F | Ht 62.0 in | Wt 243.0 lb

## 2013-12-21 DIAGNOSIS — J206 Acute bronchitis due to rhinovirus: Secondary | ICD-10-CM

## 2013-12-21 MED ORDER — HYDROCODONE-HOMATROPINE 5-1.5 MG/5ML PO SYRP
5.0000 mL | ORAL_SOLUTION | Freq: Three times a day (TID) | ORAL | Status: DC | PRN
Start: 2013-12-21 — End: 2014-01-26

## 2013-12-21 MED ORDER — ALBUTEROL SULFATE HFA 108 (90 BASE) MCG/ACT IN AERS: 2.0000 | INHALATION_SPRAY | Freq: Four times a day (QID) | RESPIRATORY_TRACT | Status: DC | PRN

## 2013-12-21 MED ORDER — LEVOFLOXACIN 500 MG PO TABS: 500.0000 mg | ORAL_TABLET | Freq: Every day | ORAL | Status: DC

## 2013-12-21 MED ORDER — METHYLPREDNISOLONE (PAK) 4 MG PO TABS: ORAL_TABLET | ORAL | Status: DC

## 2013-12-21 NOTE — Progress Notes (Signed)
   Subjective:    Patient ID: Ruth Rivers, female    DOB: [birth year], 38 y.o.   MRN: [id number]  HPI Patient is here with c/o cough and uri sx's  Review of Systems  Constitutional: Negative for fever.  HENT: Negative for ear pain.   Eyes: Negative for discharge.  Respiratory: Negative for cough.   Cardiovascular: Negative for chest pain.  Gastrointestinal: Negative for abdominal distention.  Endocrine: Negative for polyuria.  Genitourinary: Negative for difficulty urinating.  Musculoskeletal: Negative for gait problem and neck pain.  Skin: Negative for color change and rash.  Neurological: Negative for speech difficulty and headaches.  Psychiatric/Behavioral: Negative for agitation.       Objective:    BP 112/76 mmHg  Pulse 71  Temp(Src) 97.5 F (36.4 C) (Oral)  Ht 5\' 2"  (1.575 m)  Wt 243 lb (110.224 kg)  BMI 44.43 kg/m2 Physical Exam  Constitutional: She is oriented to person, place, and time. She appears well-developed and well-nourished.  HENT:  Head: Normocephalic and atraumatic.  Mouth/Throat: Oropharynx is clear and moist.  Eyes: Pupils are equal, round, and reactive to light.  Neck: Normal range of motion. Neck supple.  Cardiovascular: Normal rate and regular rhythm.   No murmur heard. Pulmonary/Chest: Effort normal and breath sounds normal.  Abdominal: Soft. Bowel sounds are normal. There is no tenderness.  Neurological: She is alert and oriented to person, place, and time.  Skin: Skin is warm and dry.  Psychiatric: She has a normal mood and affect.          Assessment & Plan:     ICD-9-CM ICD-10-CM   1. Acute bronchitis due to Rhinovirus 466.0 J20.6 HYDROcodone-homatropine (HYCODAN) 5-1.5 MG/5ML syrup   079.3  levofloxacin (LEVAQUIN) 500 MG tablet     methylPREDNIsolone (MEDROL DOSPACK) 4 MG tablet     albuterol (PROVENTIL HFA;VENTOLIN HFA) 108 (90 BASE) MCG/ACT inhaler     Return if symptoms worsen or fail to improve.  Lysbeth Penner FNP

## 2014-01-26 ENCOUNTER — Encounter: Payer: Self-pay | Admitting: Family Medicine

## 2014-01-26 ENCOUNTER — Ambulatory Visit (INDEPENDENT_AMBULATORY_CARE_PROVIDER_SITE_OTHER): Payer: BLUE CROSS/BLUE SHIELD | Admitting: Family Medicine

## 2014-01-26 VITALS — BP 119/70 | HR 73 | Temp 99.2°F | Ht 62.0 in | Wt 244.4 lb

## 2014-01-26 DIAGNOSIS — R6889 Other general symptoms and signs: Secondary | ICD-10-CM

## 2014-01-26 DIAGNOSIS — B999 Unspecified infectious disease: Secondary | ICD-10-CM

## 2014-01-26 MED ORDER — MUPIROCIN CALCIUM 2 % NA OINT: 1.0000 "application " | TOPICAL_OINTMENT | Freq: Three times a day (TID) | NASAL | Status: DC

## 2014-01-26 NOTE — Addendum Note (Signed)
Addended by: Jamelle Haring on: 01/26/2014 04:08 PM   Modules accepted: Orders

## 2014-01-26 NOTE — Progress Notes (Signed)
   Subjective:    Patient ID: Ruth Rivers, female    DOB: Feb 24, 1975, 39 y.o.   MRN: [id number]  HPI 39 year old female with a several month history of a sore in her left nare. Pain radiates throughout her mid face. She denies any sacral sinus congestion postnasal drainage or nasal discharge. The left side of her nose has bled some since this began. She has not been treated with any medications to this point.    Review of Systems  Constitutional: Negative.   HENT: Positive for facial swelling.   Respiratory: Negative.   Cardiovascular: Negative.   Gastrointestinal: Negative.   Genitourinary: Negative.   Musculoskeletal: Negative.   Neurological: Negative.        Objective:   Physical Exam  Constitutional: She appears well-developed.  HENT:  Mouth/Throat: Oropharynx is clear and moist.  There is a sore boil on the lateral aspect of her left nostril. It is tender to palpation with the Q-tip.  Pulmonary/Chest: Effort normal.          Assessment & Plan:  1. Suspected soft tissue infection Will treat locally with Bactroban and warm compresses to the outside of the nose. Patient to let us know if symptoms do not resolve

## 2014-04-26 ENCOUNTER — Ambulatory Visit (INDEPENDENT_AMBULATORY_CARE_PROVIDER_SITE_OTHER): Payer: BC Managed Care – PPO | Admitting: Family

## 2014-04-26 ENCOUNTER — Encounter: Payer: Self-pay | Admitting: Family

## 2014-04-26 VITALS — BP 113/77 | HR 84 | Temp 98.5°F | Ht 62.0 in | Wt 240.6 lb

## 2014-04-26 DIAGNOSIS — M545 Low back pain: Secondary | ICD-10-CM | POA: Diagnosis not present

## 2014-04-26 MED ORDER — CYCLOBENZAPRINE HCL 10 MG PO TABS: 10.0000 mg | ORAL_TABLET | Freq: Three times a day (TID) | ORAL | Status: DC | PRN

## 2014-04-26 MED ORDER — KETOROLAC TROMETHAMINE 60 MG/2ML IM SOLN
60.0000 mg | Freq: Once | INTRAMUSCULAR | Status: AC
  Administered 2014-04-26: 60 mg via INTRAMUSCULAR

## 2014-04-26 MED ORDER — MELOXICAM 15 MG PO TABS: 15.0000 mg | ORAL_TABLET | Freq: Every day | ORAL | Status: DC

## 2014-04-26 NOTE — Patient Instructions (Signed)
Back Pain, Adult Low back pain is very common. About 1 in 5 people have back pain.The cause of low back pain is rarely dangerous. The pain often gets better over time.About half of people with a sudden onset of back pain feel better in just 2 weeks. About 8 in 10 people feel better by 6 weeks.  CAUSES Some common causes of back pain include:  Strain of the muscles or ligaments supporting the spine.  Wear and tear (degeneration) of the spinal discs.  Arthritis.  Direct injury to the back. DIAGNOSIS Most of the time, the direct cause of low back pain is not known.However, back pain can be treated effectively even when the exact cause of the pain is unknown.Answering your caregiver's questions about your overall health and symptoms is one of the most accurate ways to make sure the cause of your pain is not dangerous. If your caregiver needs more information, he or she may order lab work or imaging tests (X-rays or MRIs).However, even if imaging tests show changes in your back, this usually does not require surgery. HOME CARE INSTRUCTIONS For many people, back pain returns.Since low back pain is rarely dangerous, it is often a condition that people can learn to manageon their own.   Remain active. It is stressful on the back to sit or stand in one place. Do not sit, drive, or stand in one place for more than 30 minutes at a time. Take short walks on level surfaces as soon as pain allows.Try to increase the length of time you walk each day.  Do not stay in bed.Resting more than 1 or 2 days can delay your recovery.  Do not avoid exercise or work.Your body is made to move.It is not dangerous to be active, even though your back may hurt.Your back will likely heal faster if you return to being active before your pain is gone.  Pay attention to your body when you bend and lift. Many people have less discomfortwhen lifting if they bend their knees, keep the load close to their bodies,and  avoid twisting. Often, the most comfortable positions are those that put less stress on your recovering back.  Find a comfortable position to sleep. Use a firm mattress and lie on your side with your knees slightly bent. If you lie on your back, put a pillow under your knees.  Only take over-the-counter or prescription medicines as directed by your caregiver. Over-the-counter medicines to reduce pain and inflammation are often the most helpful.Your caregiver may prescribe muscle relaxant drugs.These medicines help dull your pain so you can more quickly return to your normal activities and healthy exercise.  Put ice on the injured area.  Put ice in a plastic bag.  Place a towel between your skin and the bag.  Leave the ice on for 15-20 minutes, 03-04 times a day for the first 2 to 3 days. After that, ice and heat may be alternated to reduce pain and spasms.  Ask your caregiver about trying back exercises and gentle massage. This may be of some benefit.  Avoid feeling anxious or stressed.Stress increases muscle tension and can worsen back pain.It is important to recognize when you are anxious or stressed and learn ways to manage it.Exercise is a great option. SEEK MEDICAL CARE IF:  You have pain that is not relieved with rest or medicine.  You have pain that does not improve in 1 week.  You have new symptoms.  You are generally not feeling well. SEEK   IMMEDIATE MEDICAL CARE IF:   You have pain that radiates from your back into your legs.  You develop new bowel or bladder control problems.  You have unusual weakness or numbness in your arms or legs.  You develop nausea or vomiting.  You develop abdominal pain.  You feel faint. Document Released: 01/01/2005 Document Revised: 07/03/2011 Document Reviewed: 05/05/2013 [employer] Patient Information 2015 [employer]. This information is not intended to replace advice given to you by your health care provider. Make sure you  discuss any questions you have with your health care provider.  

## 2014-04-26 NOTE — Progress Notes (Signed)
   Subjective:    Patient ID: Ruth Rivers, female    DOB: 04-13-1975, 39 y.o.   MRN: [id number]  Back Pain This is a new problem. The current episode started yesterday. The problem occurs constantly. The problem is unchanged. The pain is present in the lumbar spine. The quality of the pain is described as aching. The pain does not radiate. The pain is at a severity of 6/10. The pain is moderate. The symptoms are aggravated by bending, position and twisting. Pertinent negatives include no bladder incontinence, bowel incontinence, dysuria, headaches, leg pain, numbness or tingling. She has tried NSAIDs for the symptoms. The treatment provided mild relief.      Review of Systems  Constitutional: Negative.   HENT: Negative.   Eyes: Negative.   Respiratory: Negative.  Negative for shortness of breath.   Cardiovascular: Negative.  Negative for palpitations.  Gastrointestinal: Negative.  Negative for bowel incontinence.  Endocrine: Negative.   Genitourinary: Negative.  Negative for bladder incontinence and dysuria.  Musculoskeletal: Positive for back pain.  Neurological: Negative.  Negative for tingling, numbness and headaches.  Hematological: Negative.   Psychiatric/Behavioral: Negative.   All other systems reviewed and are negative.      Objective:   Physical Exam  Constitutional: She is oriented to person, place, and time. She appears well-developed and well-nourished. No distress.  HENT:  Head: Normocephalic and atraumatic.  Eyes: Pupils are equal, round, and reactive to light.  Neck: Normal range of motion. Neck supple. No thyromegaly present.  Cardiovascular: Normal rate, regular rhythm, normal heart sounds and intact distal pulses.   No murmur heard. Pulmonary/Chest: Effort normal and breath sounds normal. No respiratory distress. She has no wheezes.  Abdominal: Soft. Bowel sounds are normal. She exhibits no distension. There is no tenderness.  Musculoskeletal: Normal range of  motion. She exhibits no edema or tenderness.  Neurological: She is alert and oriented to person, place, and time. She has normal reflexes. No cranial nerve deficit.  Skin: Skin is warm and dry.  Psychiatric: She has a normal mood and affect. Her behavior is normal. Judgment and thought content normal.  Vitals reviewed.     BP 113/77 mmHg  Pulse 84  Temp(Src) 98.5 F (36.9 C) (Oral)  Ht 5\' 2"  (1.575 m)  Wt 240 lb 9.6 oz (109.135 kg)  BMI 43.99 kg/m2     Assessment & Plan:  1. Bilateral low back pain, with sciatica presence unspecified -rest -Ice and heat as needed -No other NSAID's  -Sedation precaution discussed  - cyclobenzaprine (FLEXERIL) 10 MG tablet; Take 1 tablet (10 mg total) by mouth 3 (three) times daily as needed for muscle spasms.  Dispense: 30 tablet; Refill: 0 - meloxicam (MOBIC) 15 MG tablet; Take 1 tablet (15 mg total) by mouth daily.  Dispense: 30 tablet; Refill: 0 - ketorolac (TORADOL) injection 60 mg; Inject 2 mLs (60 mg total) into the muscle once.  Evelina Dun, FNP

## 2014-05-05 ENCOUNTER — Other Ambulatory Visit: Payer: Self-pay | Admitting: Family

## 2014-05-07 ENCOUNTER — Other Ambulatory Visit: Payer: Self-pay | Admitting: Family

## 2014-05-10 ENCOUNTER — Other Ambulatory Visit: Payer: Self-pay | Admitting: Family

## 2014-05-27 ENCOUNTER — Encounter: Payer: Self-pay | Admitting: Family

## 2014-05-27 ENCOUNTER — Ambulatory Visit (INDEPENDENT_AMBULATORY_CARE_PROVIDER_SITE_OTHER): Payer: BC Managed Care – PPO | Admitting: Family

## 2014-05-27 VITALS — BP 112/79 | HR 79 | Temp 97.5°F | Ht 62.0 in | Wt 248.0 lb

## 2014-05-27 DIAGNOSIS — J069 Acute upper respiratory infection, unspecified: Secondary | ICD-10-CM

## 2014-05-27 DIAGNOSIS — J309 Allergic rhinitis, unspecified: Secondary | ICD-10-CM | POA: Diagnosis not present

## 2014-05-27 MED ORDER — HYDROCODONE-HOMATROPINE 5-1.5 MG/5ML PO SYRP: 5.0000 mL | ORAL_SOLUTION | Freq: Three times a day (TID) | ORAL | Status: DC | PRN

## 2014-05-27 MED ORDER — METHYLPREDNISOLONE ACETATE 80 MG/ML IJ SUSP
80.0000 mg | Freq: Once | INTRAMUSCULAR | Status: AC
Start: 2014-05-27 — End: 2014-05-27
  Administered 2014-05-27: 80 mg via INTRAMUSCULAR

## 2014-05-27 MED ORDER — FLUTICASONE PROPIONATE 50 MCG/ACT NA SUSP: 2.0000 | Freq: Every day | NASAL | Status: DC

## 2014-05-27 MED ORDER — METHYLPREDNISOLONE 4 MG PO TBPK: ORAL_TABLET | ORAL | Status: DC

## 2014-05-27 MED ORDER — BENZONATATE 200 MG PO CAPS: 200.0000 mg | ORAL_CAPSULE | Freq: Three times a day (TID) | ORAL | Status: DC | PRN

## 2014-05-27 NOTE — Progress Notes (Signed)
Subjective:    Patient ID: Ruth Rivers, female    DOB: [birth year], 39 y.o.   MRN: [id number]  Sinus Problem This is a new problem. The current episode started in the past 7 days. The problem has been gradually worsening since onset. There has been no fever. She is experiencing no pain. Associated symptoms include congestion, coughing, ear pain, a hoarse voice, shortness of breath, sneezing and a sore throat. Pertinent negatives include no headaches or sinus pressure. Past treatments include oral decongestants.      Review of Systems  Constitutional: Negative.   HENT: Positive for congestion, ear pain, hoarse voice, sneezing and sore throat. Negative for sinus pressure.   Eyes: Negative.   Respiratory: Positive for cough and shortness of breath.   Cardiovascular: Negative.  Negative for palpitations.  Gastrointestinal: Negative.   Endocrine: Negative.   Genitourinary: Negative.   Musculoskeletal: Negative.   Neurological: Negative.  Negative for headaches.  Hematological: Negative.   Psychiatric/Behavioral: Negative.   All other systems reviewed and are negative.      Objective:   Physical Exam  Constitutional: She is oriented to person, place, and time. She appears well-developed and well-nourished. No distress.  HENT:  Head: Normocephalic and atraumatic.  Right Ear: External ear normal.  Left Ear: External ear normal.  Nasal passage erythemas with mild swelling  Oropharynx erythemas  Eyes: Pupils are equal, round, and reactive to light.  Neck: Normal range of motion. Neck supple. No thyromegaly present.  Cardiovascular: Normal rate, regular rhythm, normal heart sounds and intact distal pulses.   No murmur heard. Pulmonary/Chest: Effort normal and breath sounds normal. No respiratory distress. She has no wheezes.  Abdominal: Soft. Bowel sounds are normal. She exhibits no distension. There is no tenderness.  Musculoskeletal: Normal range of motion. She exhibits no edema  or tenderness.  Neurological: She is alert and oriented to person, place, and time. She has normal reflexes. No cranial nerve deficit.  Skin: Skin is warm and dry.  Psychiatric: She has a normal mood and affect. Her behavior is normal. Judgment and thought content normal.  Vitals reviewed.     BP 112/79 mmHg  Pulse 79  Temp(Src) 97.5 F (36.4 C) (Oral)  Ht 5\' 2"  (1.575 m)  Wt 248 lb (112.492 kg)  BMI 45.35 kg/m2  SpO2 98%     Assessment & Plan:  1. Acute upper respiratory infection - Take meds as prescribed - Use a cool mist humidifier  -Use saline nose sprays frequently -Saline irrigations of the nose can be very helpful if done frequently.  * 4X daily for 1 week*  * Use of a nettie pot can be helpful with this. Follow directions with this* -Force fluids -For any cough or congestion  Use plain Mucinex- regular strength or max strength is fine   * Children- consult with Pharmacist for dosing -For fever or aces or pains- take tylenol or ibuprofen appropriate for age and weight.  * for fevers greater than 101 orally you may alternate ibuprofen and tylenol every  3 hours. -Throat lozenges if help - methylPREDNISolone (MEDROL DOSEPAK) 4 MG TBPK tablet; Use as directed  Dispense: 21 tablet; Refill: 0 - benzonatate (TESSALON) 200 MG capsule; Take 1 capsule (200 mg total) by mouth 3 (three) times daily as needed.  Dispense: 30 capsule; Refill: 1 - HYDROcodone-homatropine (HYCODAN) 5-1.5 MG/5ML syrup; Take 5 mLs by mouth every 8 (eight) hours as needed for cough.  Dispense: 120 mL; Refill: 0 - methylPREDNISolone acetate (DEPO-MEDROL) injection  80 mg; Inject 1 mL (80 mg total) into the muscle once.  2. Allergic rhinitis, unspecified allergic rhinitis type -Start daily Claritin or Zyrtec OTC - methylPREDNISolone acetate (DEPO-MEDROL) injection 80 mg; Inject 1 mL (80 mg total) into the muscle once. - fluticasone (FLONASE) 50 MCG/ACT nasal spray; Place 2 sprays into both nostrils daily.   Dispense: 16 g; Refill: 6

## 2014-05-27 NOTE — Patient Instructions (Signed)
Allergic Rhinitis Allergic rhinitis is when the mucous membranes in the nose respond to allergens. Allergens are particles in the air that cause your body to have an allergic reaction. This causes you to release allergic antibodies. Through a chain of events, these eventually cause you to release histamine into the blood stream. Although meant to protect the body, it is this release of histamine that causes your discomfort, such as frequent sneezing, congestion, and an itchy, runny nose.  CAUSES  Seasonal allergic rhinitis (hay fever) is caused by pollen allergens that may come from grasses, trees, and weeds. Year-round allergic rhinitis (perennial allergic rhinitis) is caused by allergens such as house dust mites, pet dander, and mold spores.  SYMPTOMS   Nasal stuffiness (congestion).  Itchy, runny nose with sneezing and tearing of the eyes. DIAGNOSIS  Your health care provider can help you determine the allergen or allergens that trigger your symptoms. If you and your health care provider are unable to determine the allergen, skin or blood testing may be used. TREATMENT  Allergic rhinitis does not have a cure, but it can be controlled by:  Medicines and allergy shots (immunotherapy).  Avoiding the allergen. Hay fever may often be treated with antihistamines in pill or nasal spray forms. Antihistamines block the effects of histamine. There are over-the-counter medicines that may help with nasal congestion and swelling around the eyes. Check with your health care provider before taking or giving this medicine.  If avoiding the allergen or the medicine prescribed do not work, there are many new medicines your health care provider can prescribe. Stronger medicine may be used if initial measures are ineffective. Desensitizing injections can be used if medicine and avoidance does not work. Desensitization is when a patient is given ongoing shots until the body becomes less sensitive to the allergen.  Make sure you follow up with your health care provider if problems continue. HOME CARE INSTRUCTIONS It is not possible to completely avoid allergens, but you can reduce your symptoms by taking steps to limit your exposure to them. It helps to know exactly what you are allergic to so that you can avoid your specific triggers. SEEK MEDICAL CARE IF:   You have a fever.  You develop a cough that does not stop easily (persistent).  You have shortness of breath.  You start wheezing.  Symptoms interfere with normal daily activities. Document Released: 09/26/2000 Document Revised: 01/06/2013 Document Reviewed: 09/08/2012 [facility] Patient Information 2015 [place], [place]. This information is not intended to replace advice given to you by your health care provider. Make sure you discuss any questions you have with your health care provider. Upper Respiratory Infection, Adult An upper respiratory infection (URI) is also sometimes known as the common cold. The upper respiratory tract includes the nose, sinuses, throat, trachea, and bronchi. Bronchi are the airways leading to the lungs. Most people improve within 1 week, but symptoms can last up to 2 weeks. A residual cough may last even longer.  CAUSES Many different viruses can infect the tissues lining the upper respiratory tract. The tissues become irritated and inflamed and often become very moist. Mucus production is also common. A cold is contagious. You can easily spread the virus to others by oral contact. This includes kissing, sharing a glass, coughing, or sneezing. Touching your mouth or nose and then touching a surface, which is then touched by another person, can also spread the virus. SYMPTOMS  Symptoms typically develop 1 to 3 days after you come in contact with  a cold virus. Symptoms vary from person to person. They may include:  Runny nose.  Sneezing.  Nasal congestion.  Sinus irritation.  Sore throat.  Loss of voice  (laryngitis).  Cough.  Fatigue.  Muscle aches.  Loss of appetite.  Headache.  Low-grade fever. DIAGNOSIS  You might diagnose your own cold based on familiar symptoms, since most people get a cold 2 to 3 times a year. Your caregiver can confirm this based on your exam. Most importantly, your caregiver can check that your symptoms are not due to another disease such as strep throat, sinusitis, pneumonia, asthma, or epiglottitis. Blood tests, throat tests, and X-rays are not necessary to diagnose a common cold, but they may sometimes be helpful in excluding other more serious diseases. Your caregiver will decide if any further tests are required. RISKS AND COMPLICATIONS  You may be at risk for a more severe case of the common cold if you smoke cigarettes, have chronic heart disease (such as heart failure) or lung disease (such as asthma), or if you have a weakened immune system. The very young and very old are also at risk for more serious infections. Bacterial sinusitis, middle ear infections, and bacterial pneumonia can complicate the common cold. The common cold can worsen asthma and chronic obstructive pulmonary disease (COPD). Sometimes, these complications can require emergency medical care and may be life-threatening. PREVENTION  The best way to protect against getting a cold is to practice good hygiene. Avoid oral or hand contact with people with cold symptoms. Wash your hands often if contact occurs. There is no clear evidence that vitamin C, vitamin E, echinacea, or exercise reduces the chance of developing a cold. However, it is always recommended to get plenty of rest and practice good nutrition. TREATMENT  Treatment is directed at relieving symptoms. There is no cure. Antibiotics are not effective, because the infection is caused by a virus, not by bacteria. Treatment may include:  Increased fluid intake. Sports drinks offer valuable electrolytes, sugars, and fluids.  Breathing  heated mist or steam (vaporizer or shower).  Eating chicken soup or other clear broths, and maintaining good nutrition.  Getting plenty of rest.  Using gargles or lozenges for comfort.  Controlling fevers with ibuprofen or acetaminophen as directed by your caregiver.  Increasing usage of your inhaler if you have asthma. Zinc gel and zinc lozenges, taken in the first 24 hours of the common cold, can shorten the duration and lessen the severity of symptoms. Pain medicines may help with fever, muscle aches, and throat pain. A variety of non-prescription medicines are available to treat congestion and runny nose. Your caregiver can make recommendations and may suggest nasal or lung inhalers for other symptoms.  HOME CARE INSTRUCTIONS   Only take over-the-counter or prescription medicines for pain, discomfort, or fever as directed by your caregiver.  Use a warm mist humidifier or inhale steam from a shower to increase air moisture. This may keep secretions moist and make it easier to breathe.  Drink enough water and fluids to keep your urine clear or pale yellow.  Rest as needed.  Return to work when your temperature has returned to normal or as your caregiver advises. You may need to stay home longer to avoid infecting others. You can also use a face mask and careful hand washing to prevent spread of the virus. SEEK MEDICAL CARE IF:   After the first few days, you feel you are getting worse rather than better.  You need your  caregiver's advice about medicines to control symptoms.  You develop chills, worsening shortness of breath, or brown or red sputum. These may be signs of pneumonia.  You develop yellow or brown nasal discharge or pain in the face, especially when you bend forward. These may be signs of sinusitis.  You develop a fever, swollen neck glands, pain with swallowing, or white areas in the back of your throat. These may be signs of strep throat. SEEK IMMEDIATE MEDICAL CARE  IF:   You have a fever.  You develop severe or persistent headache, ear pain, sinus pain, or chest pain.  You develop wheezing, a prolonged cough, cough up blood, or have a change in your usual mucus (if you have chronic lung disease).  You develop sore muscles or a stiff neck. Document Released: 06/27/2000 Document Revised: 03/26/2011 Document Reviewed: 04/08/2013 [facility] Patient Information 2015 [place], [place]. This information is not intended to replace advice given to you by your health care provider. Make sure you discuss any questions you have with your health care provider.

## 2014-08-30 ENCOUNTER — Ambulatory Visit (INDEPENDENT_AMBULATORY_CARE_PROVIDER_SITE_OTHER): Payer: BC Managed Care – PPO | Admitting: Family Medicine

## 2014-08-30 ENCOUNTER — Encounter: Payer: Self-pay | Admitting: Family Medicine

## 2014-08-30 VITALS — BP 110/77 | HR 79 | Temp 97.7°F | Ht 62.0 in | Wt 239.4 lb

## 2014-08-30 DIAGNOSIS — R112 Nausea with vomiting, unspecified: Secondary | ICD-10-CM | POA: Insufficient documentation

## 2014-08-30 LAB — POCT URINE PREGNANCY: Preg Test, Ur: NEGATIVE

## 2014-08-30 MED ORDER — PROMETHAZINE HCL 25 MG PO TABS: 25.0000 mg | ORAL_TABLET | Freq: Three times a day (TID) | ORAL | Status: DC | PRN

## 2014-08-30 MED ORDER — PROMETHAZINE HCL 25 MG/ML IJ SOLN
25.0000 mg | Freq: Once | INTRAMUSCULAR | Status: AC
  Administered 2014-08-30: 25 mg via INTRAMUSCULAR

## 2014-08-30 NOTE — Patient Instructions (Signed)

## 2014-08-30 NOTE — Progress Notes (Signed)
BP 110/77 mmHg  Pulse 79  Temp(Src) 97.7 F (36.5 C) (Oral)  Ht 5\' 2"  (1.575 m)  Wt 239 lb 6.4 oz (108.591 kg)  BMI 43.78 kg/m2  LMP 08/04/2014 (Approximate)   Subjective:    Patient ID: Ruth Rivers, female    DOB: February 03, 1975, 39 y.o.   MRN: [id number]  HPI: Ruth Rivers is a 39 y.o. female presenting on 08/30/2014 for Nausea and vomiting   HPI Vomiting Patient has been having recurrent nausea and vomiting for the past 6 days. She went and saw [place] urgent care 2 days ago. They gave her Zofran and did some labs but did not find anything positive. She has the vomiting more in the morning. She says she has some good days and some bad days, today with one of the bad days where she describes up to 10 episodes of vomiting this morning. Each episode of vomiting small amount mostly liquid and appears as though the Sprite that she drank this morning. She is married and currently sexually active with her husband but does not believe there is possibility that she could be pregnant, she is not currently using any form of birth control. She denies any blood in her vomitus, she denies any diarrhea at this point, and denies any fevers or chills.  Relevant past medical, surgical, family and social history reviewed and updated as indicated. Interim medical history since our last visit reviewed. Allergies and medications reviewed and updated.  Review of Systems  Constitutional: Negative for fever and chills.  HENT: Negative for congestion, ear discharge and ear pain.   Eyes: Negative for redness and visual disturbance.  Respiratory: Negative for chest tightness and shortness of breath.   Cardiovascular: Negative for chest pain and leg swelling.  Gastrointestinal: Positive for nausea and vomiting (no blood). Negative for abdominal pain, diarrhea and anal bleeding.  Genitourinary: Negative for dysuria and difficulty urinating.  Musculoskeletal: Negative for back pain and gait problem.  Skin:  Negative for rash.  Neurological: Negative for light-headedness and headaches.  Psychiatric/Behavioral: Negative for behavioral problems and agitation.  All other systems reviewed and are negative.   Per HPI unless specifically indicated above     Medication List       This list is accurate as of: 08/30/14 10:54 AM.  Always use your most recent med list.               BRINTELLIX 5 MG Tabs  Generic drug:  Vortioxetine HBr  Take 5 mg by mouth daily.     BRINTELLIX 10 MG Tabs  Generic drug:  Vortioxetine HBr  Take 10 mg by mouth daily.     buPROPion 300 MG 24 hr tablet  Commonly known as:  WELLBUTRIN XL     clonazePAM 1 MG tablet  Commonly known as:  KLONOPIN  Take 0.5 mg by mouth as needed.     ondansetron 4 MG tablet  Commonly known as:  ZOFRAN  Take 4 mg by mouth 4 (four) times daily.     promethazine 25 MG tablet  Commonly known as:  PHENERGAN  Take 1 tablet (25 mg total) by mouth every 8 (eight) hours as needed for nausea or vomiting.           Objective:    BP 110/77 mmHg  Pulse 79  Temp(Src) 97.7 F (36.5 C) (Oral)  Ht 5\' 2"  (1.575 m)  Wt 239 lb 6.4 oz (108.591 kg)  BMI 43.78 kg/m2  LMP 08/04/2014 (Approximate)  Wt  Readings from Last 3 Encounters:  08/30/14 239 lb 6.4 oz (108.591 kg)  05/27/14 248 lb (112.492 kg)  04/26/14 240 lb 9.6 oz (109.135 kg)    Physical Exam  Constitutional: She is oriented to person, place, and time. She appears well-developed and well-nourished. No distress.  Eyes: Conjunctivae and EOM are normal. Pupils are equal, round, and reactive to light.  Cardiovascular: Normal rate and regular rhythm.   No murmur heard. Pulmonary/Chest: Effort normal and breath sounds normal. No respiratory distress. She has no wheezes.  Abdominal: Soft. Bowel sounds are normal. She exhibits no distension. There is no tenderness. There is no rebound and no guarding.  Musculoskeletal: Normal range of motion. She exhibits no edema or tenderness.    Neurological: She is alert and oriented to person, place, and time. Coordination normal.  Skin: Skin is warm and dry. No rash noted. She is not diaphoretic.  Psychiatric: She has a normal mood and affect. Her behavior is normal.  Vitals reviewed.   Results for orders placed or performed in visit on 08/30/14  POCT urine pregnancy  Result Value Ref Range   Preg Test, Ur Negative Negative      Assessment & Plan:   Problem List Items Addressed This Visit      Digestive   Nausea with vomiting - Primary   Relevant Medications   promethazine (PHENERGAN) injection 25 mg (Completed)   Other Relevant Orders   POCT urine pregnancy (Completed)       Follow up plan: Return if symptoms worsen or fail to improve.  [name], MD [facility] 08/30/2014, 10:54 AM

## 2014-08-31 ENCOUNTER — Telehealth: Payer: Self-pay | Admitting: Family Medicine

## 2014-08-31 MED ORDER — ONDANSETRON 4 MG PO TBDP: ORAL_TABLET | ORAL | Status: DC

## 2014-08-31 NOTE — Telephone Encounter (Signed)
snet in dissolving sublinguinal tablets to pharmacy

## 2014-08-31 NOTE — Telephone Encounter (Signed)
Detailed message left for patient.

## 2014-11-22 ENCOUNTER — Encounter: Payer: Self-pay | Admitting: Internal Medicine

## 2014-12-14 ENCOUNTER — Telehealth: Payer: Self-pay | Admitting: Family Medicine

## 2015-01-18 ENCOUNTER — Encounter: Payer: Self-pay | Admitting: Internal Medicine

## 2015-02-03 ENCOUNTER — Encounter: Payer: Self-pay | Admitting: Family Medicine

## 2015-02-03 ENCOUNTER — Ambulatory Visit (INDEPENDENT_AMBULATORY_CARE_PROVIDER_SITE_OTHER): Payer: BC Managed Care – PPO | Admitting: Family Medicine

## 2015-02-03 VITALS — BP 112/76 | HR 73 | Temp 98.6°F | Ht 62.0 in | Wt 245.4 lb

## 2015-02-03 DIAGNOSIS — G4700 Insomnia, unspecified: Secondary | ICD-10-CM | POA: Diagnosis not present

## 2015-02-03 NOTE — Progress Notes (Signed)
BP 112/76 mmHg  Pulse 73  Temp(Src) 98.6 F (37 C) (Oral)  Ht 5\' 2"  (1.575 m)  Wt 245 lb 6.4 oz (111.313 kg)  BMI 44.87 kg/m2   Subjective:    Patient ID: Ruth Rivers, female    DOB: 09/22/1975, 40 y.o.   MRN: [id number]  HPI: Ruth Rivers is a 40 y.o. female presenting on 02/03/2015 for Insomnia   HPI Insomnia and anxiety Patient has been having issues with increased insomnia and increased anxiety over the past year. She feels like her anxiety is what's keeping her up and waking her up in the middle of night. She says she has had a sleep study previously. She says she is having very vivid dreams when they wake her up and make her feel like her sleep is not refreshing. She denies any suicidal ideations or thoughts of hurting himself at this point. We discussed possible changes but because she is artery on so many psychiatric medications and see psychiatry for that would like to wait until she can see them.  Relevant past medical, surgical, family and social history reviewed and updated as indicated. Interim medical history since our last visit reviewed. Allergies and medications reviewed and updated.  Review of Systems  Constitutional: Negative for fever and chills.  HENT: Negative for congestion, ear discharge and ear pain.   Eyes: Negative for redness and visual disturbance.  Respiratory: Negative for chest tightness and shortness of breath.   Cardiovascular: Negative for chest pain and leg swelling.  Genitourinary: Negative for dysuria and difficulty urinating.  Musculoskeletal: Negative for back pain and gait problem.  Skin: Negative for rash.  Neurological: Negative for light-headedness and headaches.  Psychiatric/Behavioral: Positive for sleep disturbance. Negative for suicidal ideas, behavioral problems, self-injury and agitation. The patient is nervous/anxious.   All other systems reviewed and are negative.   Per HPI unless specifically indicated above       Medication List       This list is accurate as of: 02/03/15 12:55 PM.  Always use your most recent med list.               BRINTELLIX 5 MG Tabs  Generic drug:  Vortioxetine HBr  Take 5 mg by mouth daily.     BRINTELLIX 10 MG Tabs  Generic drug:  Vortioxetine HBr  Take 10 mg by mouth daily.     buPROPion 300 MG 24 hr tablet  Commonly known as:  WELLBUTRIN XL     clonazePAM 1 MG tablet  Commonly known as:  KLONOPIN  Take 0.5 mg by mouth as needed.     ondansetron 4 MG disintegrating tablet  Commonly known as:  ZOFRAN ODT  1 tablet dissolve under tongue every 6 hours prn     ondansetron 4 MG tablet  Commonly known as:  ZOFRAN  Take 4 mg by mouth 4 (four) times daily. Reported on 02/03/2015     promethazine 25 MG tablet  Commonly known as:  PHENERGAN  Take 1 tablet (25 mg total) by mouth every 8 (eight) hours as needed for nausea or vomiting.     traZODone 100 MG tablet  Commonly known as:  DESYREL  TAKE 2 TO 3 TABLETS BY MOUTH AT BEDTIME           Objective:    BP 112/76 mmHg  Pulse 73  Temp(Src) 98.6 F (37 C) (Oral)  Ht 5\' 2"  (1.575 m)  Wt 245 lb 6.4 oz (111.313 kg)  BMI 44.87  kg/m2  Wt Readings from Last 3 Encounters:  02/03/15 245 lb 6.4 oz (111.313 kg)  08/30/14 239 lb 6.4 oz (108.591 kg)  05/27/14 248 lb (112.492 kg)    Physical Exam  Constitutional: She is oriented to person, place, and time. She appears well-developed and well-nourished. No distress.  Eyes: Conjunctivae and EOM are normal. Pupils are equal, round, and reactive to light.  Cardiovascular: Normal rate, regular rhythm, normal heart sounds and intact distal pulses.   No murmur heard. Pulmonary/Chest: Effort normal and breath sounds normal. No respiratory distress. She has no wheezes.  Musculoskeletal: Normal range of motion. She exhibits no edema or tenderness.  Neurological: She is alert and oriented to person, place, and time. Coordination normal.  Skin: Skin is warm and dry. No  rash noted. She is not diaphoretic.  Psychiatric: Her speech is normal and behavior is normal. Judgment and thought content normal. Her mood appears anxious. Her affect is blunt. Her affect is not angry, not labile and not inappropriate. She exhibits a depressed mood. She expresses no suicidal ideation. She expresses no suicidal plans.  Nursing note and vitals reviewed.   Results for orders placed or performed in visit on 08/30/14  POCT urine pregnancy  Result Value Ref Range   Preg Test, Ur Negative Negative      Assessment & Plan:   Problem List Items Addressed This Visit      Other   Insomnia - Primary    Chest possible changes of switching trazodone to Seroquel or Abilify. Patient is hesitant and would like to discuss with her psychiatrist. Recommended adding Benadryl and taking her Klonopin at night for now until she gets in to psychiatry.          Follow up plan: Return if symptoms worsen or fail to improve.  Counseling provided for all of the vaccine components No orders of the defined types were placed in this encounter.    [name], MD [facility] 02/03/2015, 12:55 PM

## 2015-02-03 NOTE — Assessment & Plan Note (Addendum)
Chest possible changes of switching trazodone to Seroquel or Abilify. Patient is hesitant and would like to discuss with her psychiatrist. Recommended adding Benadryl and taking her Klonopin at night for now until she gets in to psychiatry.

## 2015-02-14 ENCOUNTER — Encounter: Payer: Self-pay | Admitting: Family Medicine

## 2015-02-14 ENCOUNTER — Ambulatory Visit (INDEPENDENT_AMBULATORY_CARE_PROVIDER_SITE_OTHER): Payer: BC Managed Care – PPO | Admitting: Family Medicine

## 2015-02-14 VITALS — BP 113/73 | HR 72 | Temp 97.4°F | Ht 62.0 in | Wt 249.6 lb

## 2015-02-14 DIAGNOSIS — F418 Other specified anxiety disorders: Secondary | ICD-10-CM

## 2015-02-14 DIAGNOSIS — E669 Obesity, unspecified: Secondary | ICD-10-CM

## 2015-02-14 DIAGNOSIS — F419 Anxiety disorder, unspecified: Principal | ICD-10-CM

## 2015-02-14 DIAGNOSIS — F329 Major depressive disorder, single episode, unspecified: Secondary | ICD-10-CM

## 2015-02-14 NOTE — Progress Notes (Signed)
BP 113/73 mmHg  Pulse 72  Temp(Src) 97.4 F (36.3 C) (Oral)  Ht _0  (1.575 m)  Wt 249 lb 9.6 oz (113.218 kg)  BMI 45.64 kg/m2  LMP 01/24/2015 (Approximate)   Subjective:    Patient ID: Ruth Rivers, female    DOB: 05/21/1975, 40 y.o.   MRN: [id number]  HPI: Kessa Fairbairn is a 40 y.o. female presenting on 02/14/2015 for Insomnia   HPI Anxiety depression and obesity Patient has both obesity and anxiety depression and she was instructed that her psych: [facility] would like her to get some labs tested she is due for normal lab screening as well. She has been having increased insomnia and they have adjusted her medications are going to see if that helps her in the future.  Relevant past medical, surgical, family and social history reviewed and updated as indicated. Interim medical history since our last visit reviewed. Allergies and medications reviewed and updated.  Review of Systems  Constitutional: Negative for fever and chills.  HENT: Negative for congestion, ear discharge and ear pain.   Eyes: Negative for redness and visual disturbance.  Respiratory: Negative for chest tightness and shortness of breath.   Cardiovascular: Negative for chest pain and leg swelling.  Genitourinary: Negative for dysuria and difficulty urinating.  Musculoskeletal: Negative for back pain and gait problem.  Skin: Negative for rash.  Neurological: Negative for light-headedness and headaches.  Psychiatric/Behavioral: Positive for sleep disturbance and dysphoric mood. Negative for suicidal ideas, behavioral problems, self-injury and agitation. The patient is nervous/anxious.   All other systems reviewed and are negative.   Per HPI unless specifically indicated above     Medication List       This list is accurate as of: 02/14/15  4:58 PM.  Always use your most recent med list.               BRINTELLIX 10 MG Tabs  Generic drug:  Vortioxetine HBr  Take 10 mg by mouth 2 (two) times daily.     buPROPion 300 MG 24 hr tablet  Commonly known as:  WELLBUTRIN XL     clonazePAM 1 MG tablet  Commonly known as:  KLONOPIN  Take 0.5 mg by mouth as needed.     traZODone 50 MG tablet  Commonly known as:  DESYREL  Take 50 mg by mouth at bedtime. 2 -3 tablets at bedtime           Objective:    BP 113/73 mmHg  Pulse 72  Temp(Src) 97.4 F (36.3 C) (Oral)  Ht _1  (1.575 m)  Wt 249 lb 9.6 oz (113.218 kg)  BMI 45.64 kg/m2  LMP 01/24/2015 (Approximate)  Wt Readings from Last 3 Encounters:  02/14/15 249 lb 9.6 oz (113.218 kg)  02/03/15 245 lb 6.4 oz (111.313 kg)  08/30/14 239 lb 6.4 oz (108.591 kg)    Physical Exam  Constitutional: She is oriented to person, place, and time. She appears well-developed and well-nourished. No distress.  Eyes: Conjunctivae and EOM are normal. Pupils are equal, round, and reactive to light.  Cardiovascular: Normal rate, regular rhythm, normal heart sounds and intact distal pulses.   No murmur heard. Pulmonary/Chest: Effort normal and breath sounds normal. No respiratory distress. She has no wheezes.  Musculoskeletal: Normal range of motion. She exhibits no edema or tenderness.  Neurological: She is alert and oriented to person, place, and time. Coordination normal.  Skin: Skin is warm and dry. No rash noted. She is not diaphoretic.  Psychiatric: Her behavior is normal. Judgment normal. Her mood appears anxious. She exhibits a depressed mood. She expresses no suicidal ideation. She expresses no suicidal plans.  Nursing note and vitals reviewed.   Results for orders placed or performed in visit on 08/30/14  POCT urine pregnancy  Result Value Ref Range   Preg Test, Ur Negative Negative      Assessment & Plan:   Problem List Items Addressed This Visit      Other   Anxiety and depression - Primary   Relevant Orders   Thyroid Panel With TSH (Completed)   CBC with Differential/Platelet (Completed)   VITAMIN D 25 Hydroxy (Vit-D Deficiency,  Fractures) (Completed)   Vitamin B12 (Completed)   Obesity   Relevant Orders   CMP14+EGFR (Completed)   Lipid panel (Completed)       Follow up plan: Return if symptoms worsen or fail to improve.  Counseling provided for all of the vaccine components Orders Placed This Encounter  Procedures  . Thyroid Panel With TSH  . CMP14+EGFR  . Lipid panel  . CBC with Differential/Platelet  . VITAMIN D 25 Hydroxy (Vit-D Deficiency, Fractures)  . Vitamin B12    [name], MD [facility] 02/14/2015, 4:58 PM

## 2015-02-15 LAB — CMP14+EGFR
ALT: 12 IU/L (ref 0–32)
AST: 18 IU/L (ref 0–40)
Albumin/Globulin Ratio: 1.5 (ref 1.1–2.5)
Albumin: 4.3 g/dL (ref 3.5–5.5)
Alkaline Phosphatase: 54 IU/L (ref 39–117)
BUN/Creatinine Ratio: 15 (ref 9–23)
BUN: 11 mg/dL (ref 6–24)
Bilirubin Total: 0.2 mg/dL (ref 0.0–1.2)
CALCIUM: 9.5 mg/dL (ref 8.7–10.2)
CO2: 24 mmol/L (ref 18–29)
CREATININE: 0.73 mg/dL (ref 0.57–1.00)
Chloride: 95 mmol/L — ABNORMAL LOW (ref 96–106)
GFR calc Af Amer: 118 mL/min/{1.73_m2} (ref >59)
GFR, EST NON AFRICAN AMERICAN: 103 mL/min/{1.73_m2} (ref >59)
GLOBULIN, TOTAL: 2.9 g/dL (ref 1.5–4.5)
Glucose: 83 mg/dL (ref 65–99)
Potassium: 4.5 mmol/L (ref 3.5–5.2)
Sodium: 136 mmol/L (ref 134–144)
TOTAL PROTEIN: 7.2 g/dL (ref 6.0–8.5)

## 2015-02-15 LAB — CBC WITH DIFFERENTIAL/PLATELET
BASOS: 0 %
Basophils Absolute: 0 10*3/uL (ref 0.0–0.2)
EOS (ABSOLUTE): 0.1 10*3/uL (ref 0.0–0.4)
Eos: 1 %
HEMOGLOBIN: 13.7 g/dL (ref 11.1–15.9)
Hematocrit: 40.7 % (ref 34.0–46.6)
IMMATURE GRANS (ABS): 0 10*3/uL (ref 0.0–0.1)
IMMATURE GRANULOCYTES: 0 %
LYMPHS: 16 %
Lymphocytes Absolute: 1.8 10*3/uL (ref 0.7–3.1)
MCH: 29.9 pg (ref 26.6–33.0)
MCHC: 33.7 g/dL (ref 31.5–35.7)
MCV: 89 fL (ref 79–97)
MONOCYTES: 9 %
Monocytes Absolute: 1 10*3/uL — ABNORMAL HIGH (ref 0.1–0.9)
NEUTROS ABS: 8.2 10*3/uL — AB (ref 1.4–7.0)
Neutrophils: 74 %
Platelets: 312 10*3/uL (ref 150–379)
RBC: 4.58 x10E6/uL (ref 3.77–5.28)
RDW: 12.9 % (ref 12.3–15.4)
WBC: 11.2 10*3/uL — ABNORMAL HIGH (ref 3.4–10.8)

## 2015-02-15 LAB — LIPID PANEL
CHOL/HDL RATIO: 3.1 ratio (ref 0.0–4.4)
Cholesterol, Total: 196 mg/dL (ref 100–199)
HDL: 64 mg/dL (ref >39)
LDL CALC: 115 mg/dL — AB (ref 0–99)
TRIGLYCERIDES: 84 mg/dL (ref 0–149)
VLDL Cholesterol Cal: 17 mg/dL (ref 5–40)

## 2015-02-15 LAB — THYROID PANEL WITH TSH
Free Thyroxine Index: 2 (ref 1.2–4.9)
T3 UPTAKE RATIO: 26 % (ref 24–39)
T4, Total: 7.7 ug/dL (ref 4.5–12.0)
TSH: 2.5 u[IU]/mL (ref 0.450–4.500)

## 2015-02-15 LAB — VITAMIN B12: Vitamin B-12: 314 pg/mL (ref 211–946)

## 2015-02-15 LAB — VITAMIN D 25 HYDROXY (VIT D DEFICIENCY, FRACTURES): VIT D 25 HYDROXY: 16.8 ng/mL — AB (ref 30.0–100.0)

## 2015-03-07 ENCOUNTER — Ambulatory Visit (AMBULATORY_SURGERY_CENTER): Payer: Self-pay

## 2015-03-07 VITALS — Ht 62.0 in | Wt 254.0 lb

## 2015-03-07 DIAGNOSIS — Z86010 Personal history of colonic polyps: Secondary | ICD-10-CM

## 2015-03-07 MED ORDER — NA SULFATE-K SULFATE-MG SULF 17.5-3.13-1.6 GM/177ML PO SOLN: 1.0000 | Freq: Once | ORAL | Status: DC

## 2015-03-07 NOTE — Progress Notes (Signed)
No egg or soy allergies Not on home 02 No previous anesthesia complications No diet or weight loss meds 

## 2015-03-15 ENCOUNTER — Telehealth: Payer: Self-pay | Admitting: Family Medicine

## 2015-03-21 ENCOUNTER — Ambulatory Visit (AMBULATORY_SURGERY_CENTER): Payer: BC Managed Care – PPO | Admitting: Internal Medicine

## 2015-03-21 ENCOUNTER — Encounter: Payer: Self-pay | Admitting: Internal Medicine

## 2015-03-21 VITALS — BP 108/54 | HR 69 | Temp 98.2°F | Resp 13 | Ht 62.0 in | Wt 254.0 lb

## 2015-03-21 DIAGNOSIS — D122 Benign neoplasm of ascending colon: Secondary | ICD-10-CM

## 2015-03-21 DIAGNOSIS — Z86010 Personal history of colonic polyps: Secondary | ICD-10-CM | POA: Diagnosis present

## 2015-03-21 MED ORDER — SODIUM CHLORIDE 0.9 % IV SOLN: 500.0000 mL | INTRAVENOUS | Status: DC

## 2015-03-21 NOTE — Op Note (Signed)
[name] [place], [id number]   COLONOSCOPY PROCEDURE REPORT  PATIENT: Ruth Rivers, Ruth Rivers  MR#: [id number] BIRTHDATE: Mar 18, 1975 , 40  yrs. old GENDER: female ENDOSCOPIST: Jerene Bears, MD PROCEDURE DATE:  03/21/2015 PROCEDURE:   Colonoscopy, surveillance and Colonoscopy with snare polypectomy First Screening Colonoscopy - Avg.  risk and is 50 yrs.  old or older - No.  Prior Negative Screening - Now for repeat screening. N/A  History of Adenoma - Now for follow-up colonoscopy & has been > or = to 3 yrs.  Yes hx of adenoma.  Has been 3 or more years since last colonoscopy.  History of Adenoma - Now for follow-up colonoscopy & has been > or = to 3 yrs.  Polyps removed today? 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  The digital rectal exam revealed no rectal mass.   The LB [id number]  endoscope was introduced through the anus and advanced to the cecum, which was identified by both the appendix and ileocecal valve. No adverse events experienced. The quality of the prep was good.  (Suprep was used)  The instrument was then slowly withdrawn as the colon was fully examined. Estimated blood loss is zero unless otherwise noted in this procedure report.   COLON FINDINGS: A sessile polyp measuring 12 mm in size with a mucous cap was found in the ascending colon.  A polypectomy was performed using snare cautery.  The resection was complete, the polyp tissue was completely retrieved and sent to histology.   The examination was otherwise normal.  Retroflexed views revealed small internal hemorrhoids. The time to cecum = 3.7 Withdrawal time = 13.1   The scope was withdrawn  and the procedure completed. COMPLICATIONS: There were no immediate complications.  ENDOSCOPIC IMPRESSION: 1.   Sessile polyp was found in the ascending colon; polypectomy was performed using snare cautery 2.   The examination was otherwise normal  RECOMMENDATIONS: 1.  Avoid all NSAIDs for the next 2 weeks. 2.  Await pathology results 3.  Repeat Colonoscopy in 3 years. 4.  You will receive a letter within 1-2 weeks with the results of your biopsy as well as final recommendations.  Please call my office if you have not received a letter after 3 weeks.  eSigned:  Jerene Bears, MD 03/21/2015 9:35 AM   cc:  the patient, PCP

## 2015-03-21 NOTE — Progress Notes (Signed)
To pacu vss patent aw report to rn 

## 2015-03-21 NOTE — Patient Instructions (Signed)
YOU HAD AN ENDOSCOPIC PROCEDURE TODAY AT [facility]:   Refer to the procedure report that was given to you for any specific questions about what was found during the examination.  If the procedure report does not answer your questions, please call your gastroenterologist to clarify.  If you requested that your care partner not be given the details of your procedure findings, then the procedure report has been included in a sealed envelope for you to review at your convenience later.  YOU SHOULD EXPECT: Some feelings of bloating in the abdomen. Passage of more gas than usual.  Walking can help get rid of the air that was put into your GI tract during the procedure and reduce the bloating. If you had a lower endoscopy (such as a colonoscopy or flexible sigmoidoscopy) you may notice spotting of blood in your stool or on the toilet paper. If you underwent a bowel prep for your procedure, you may not have a normal bowel movement for a few days.  Please Note:  You might notice some irritation and congestion in your nose or some drainage.  This is from the oxygen used during your procedure.  There is no need for concern and it should clear up in a day or so.  SYMPTOMS TO REPORT IMMEDIATELY:   Following lower endoscopy (colonoscopy or flexible sigmoidoscopy):  Excessive amounts of blood in the stool  Significant tenderness or worsening of abdominal pains  Swelling of the abdomen that is new, acute  Fever of 100F or higher  For urgent or emergent issues, a gastroenterologist can be reached at any hour by calling [phone].  DIET: Your first meal following the procedure should be a small meal and then it is ok to progress to your normal diet. Heavy or fried foods are harder to digest and may make you feel nauseous or bloated.  Likewise, meals heavy in dairy and vegetables can increase bloating.  Drink plenty of fluids but you should avoid alcoholic beverages for 24 hours.  ACTIVITY:   You should plan to take it easy for the rest of today and you should NOT DRIVE or use heavy machinery until tomorrow (because of the sedation medicines used during the test).    FOLLOW UP: Our staff will call the number listed on your records the next business day following your procedure to check on you and address any questions or concerns that you may have regarding the information given to you following your procedure. If we do not reach you, we will leave a message.  However, if you are feeling well and you are not experiencing any problems, there is no need to return our call.  We will assume that you have returned to your regular daily activities without incident.  If any biopsies were taken you will be contacted by phone or by letter within the next 1-3 weeks.  Please call us at [phone] if you have not heard about the biopsies in 3 weeks.   SIGNATURES/CONFIDENTIALITY: You and/or your care partner have signed paperwork which will be entered into your electronic medical record.  These signatures attest to the fact that that the information above on your After Visit Summary has been reviewed and is understood.  Full responsibility of the confidentiality of this discharge information lies with you and/or your care-partner.  Please read over handout about polyps  NO NSAIDS (ADVIL, ALEVE, IBUPROFEN, MOTRIN) FOR 2 WEEKS, continue your other normal medications  Next colonoscopy in 3 years

## 2015-03-21 NOTE — Progress Notes (Signed)
Called to room to assist during endoscopic procedure.  Patient ID and intended procedure confirmed with present staff. Received instructions for my participation in the procedure from the performing physician.  

## 2015-03-22 ENCOUNTER — Telehealth: Payer: Self-pay | Admitting: *Deleted

## 2015-03-22 NOTE — Telephone Encounter (Signed)
  Follow up Call-  Call back number 03/21/2015  Post procedure Call Back phone  # [phone]  Permission to leave phone message Yes     Patient questions:  Do you have a fever, pain , or abdominal swelling? No. Pain Score  0 *  Have you tolerated food without any problems? Yes.    Have you been able to return to your normal activities? Yes.    Do you have any questions about your discharge instructions: Diet   No. Medications  No. Follow up visit  No.  Do you have questions or concerns about your Care? No.  Actions: * If pain score is 4 or above: No action needed, pain <4.

## 2015-03-24 ENCOUNTER — Encounter: Payer: Self-pay | Admitting: Internal Medicine

## 2015-05-03 ENCOUNTER — Ambulatory Visit (INDEPENDENT_AMBULATORY_CARE_PROVIDER_SITE_OTHER): Payer: BC Managed Care – PPO | Admitting: Family Medicine

## 2015-05-03 ENCOUNTER — Encounter: Payer: Self-pay | Admitting: Family Medicine

## 2015-05-03 VITALS — BP 99/65 | HR 73 | Temp 97.8°F | Ht 62.0 in | Wt 257.4 lb

## 2015-05-03 DIAGNOSIS — K279 Peptic ulcer, site unspecified, unspecified as acute or chronic, without hemorrhage or perforation: Secondary | ICD-10-CM

## 2015-05-03 DIAGNOSIS — R300 Dysuria: Secondary | ICD-10-CM | POA: Diagnosis not present

## 2015-05-03 DIAGNOSIS — M545 Low back pain, unspecified: Secondary | ICD-10-CM

## 2015-05-03 LAB — MICROSCOPIC EXAMINATION

## 2015-05-03 LAB — URINALYSIS, COMPLETE
Bilirubin, UA: NEGATIVE
Glucose, UA: NEGATIVE
KETONES UA: NEGATIVE
Leukocytes, UA: NEGATIVE
NITRITE UA: NEGATIVE
PH UA: 6.5 (ref 5.0–7.5)
Protein, UA: NEGATIVE
SPEC GRAV UA: 1.015 (ref 1.005–1.030)
Urobilinogen, Ur: 0.2 mg/dL (ref 0.2–1.0)

## 2015-05-03 MED ORDER — CYCLOBENZAPRINE HCL 10 MG PO TABS: 10.0000 mg | ORAL_TABLET | Freq: Three times a day (TID) | ORAL | Status: DC | PRN

## 2015-05-03 MED ORDER — NAPROXEN 500 MG PO TABS: 500.0000 mg | ORAL_TABLET | Freq: Two times a day (BID) | ORAL | Status: DC

## 2015-05-03 NOTE — Patient Instructions (Signed)
Great to meet you!  Try naproxen twice daily for 1 week. Use flexeril as needed, it may make you sleepy.   Call or come back if symptoms worsen or don't get better as expected.

## 2015-05-03 NOTE — Progress Notes (Signed)
   HPI  Patient presents today with concern for UTI.  Patient's lines that she's had right-sided lower back pain, been down with no radiation to the groin over the last 2 days. She also has dysuria and urinary urgency. She also has recurrent C of urination. She denies fever, chills, sweats. She's tolerating food and fluids are normal.  She has not noticed any hematuria  PMH: Smoking status noted ROS: Per HPI  Objective: BP 99/65 mmHg  Pulse 73  Temp(Src) 97.8 F (36.6 C) (Oral)  Ht 5\' 2"  (1.575 m)  Wt 257 lb 6.4 oz (116.756 kg)  BMI 47.07 kg/m2 Gen: NAD, alert CV: RRR, good S1/S2, no murmur Resp: CTABL, no wheezes, non-labored Abd: Soft, tenderness to palpation in bilateral lower quadrants and mild suprapubic tenderness, no CVA tenderness Ext: No edema, warm Neuro: Alert and oriented, No gross deficits  MSK: tenderness to palp of L sided paraspinal muscles, no midlone tenderness  Assessment and plan:  # dysuria, back pain Possible Stone, small blood on UA Send for culture For now treat as MSK back pain, spasms Flexeril, tylenol, heat, No NSAIDs with PUD (we called pharmacy and canceled, pt told me after the visit)  Call in 2 days if symps worsening, can send for CT stone study if worsening    Orders Placed This Encounter  Procedures  . Urinalysis, Complete    Meds ordered this encounter  Medications  . DISCONTD: naproxen (NAPROSYN) 500 MG tablet    Sig: Take 1 tablet (500 mg total) by mouth 2 (two) times daily with a meal.    Dispense:  30 tablet    Refill:  0  . cyclobenzaprine (FLEXERIL) 10 MG tablet    Sig: Take 1 tablet (10 mg total) by mouth 3 (three) times daily as needed for muscle spasms.    Dispense:  30 tablet    Refill:  0    Laroy Apple, MD [facility] 05/03/2015, 4:06 PM

## 2015-05-04 ENCOUNTER — Telehealth: Payer: Self-pay | Admitting: Family Medicine

## 2015-05-04 LAB — URINE CULTURE

## 2015-05-04 NOTE — Telephone Encounter (Signed)
Patient called stating that she thinks she passed a kidney stone last night but is still having pain.  Patient thinks she has another kidney stone and would like something sent to her pharmacy.  Patient has been taking tylenol and flexeril and neither one of these are helping with the pain

## 2015-05-04 NOTE — Telephone Encounter (Signed)
We could try tramadol 50 mg 4 times a day #15

## 2015-05-05 ENCOUNTER — Telehealth: Payer: Self-pay | Admitting: Family Medicine

## 2015-05-05 NOTE — Telephone Encounter (Signed)
Patient aware of results by Amy.

## 2015-05-09 MED ORDER — TRAMADOL HCL 50 MG PO TABS: 50.0000 mg | ORAL_TABLET | Freq: Four times a day (QID) | ORAL | Status: DC

## 2015-05-09 NOTE — Addendum Note (Signed)
Addended by: Wardell Heath on: 05/09/2015 09:56 AM   Modules accepted: Orders

## 2015-05-09 NOTE — Telephone Encounter (Signed)
Patient aware.

## 2015-06-21 ENCOUNTER — Ambulatory Visit (INDEPENDENT_AMBULATORY_CARE_PROVIDER_SITE_OTHER): Payer: BC Managed Care – PPO | Admitting: Family Medicine

## 2015-06-21 VITALS — HR 90 | Temp 97.8°F | Ht 62.0 in | Wt 252.6 lb

## 2015-06-21 DIAGNOSIS — J40 Bronchitis, not specified as acute or chronic: Secondary | ICD-10-CM

## 2015-06-21 DIAGNOSIS — J329 Chronic sinusitis, unspecified: Secondary | ICD-10-CM

## 2015-06-21 MED ORDER — HYDROCODONE-HOMATROPINE 5-1.5 MG/5ML PO SYRP: 5.0000 mL | ORAL_SOLUTION | Freq: Four times a day (QID) | ORAL | Status: DC | PRN

## 2015-06-21 MED ORDER — BETAMETHASONE SOD PHOS & ACET 6 (3-3) MG/ML IJ SUSP
6.0000 mg | Freq: Once | INTRAMUSCULAR | Status: AC
  Administered 2015-06-21: 6 mg via INTRAMUSCULAR

## 2015-06-21 MED ORDER — LEVOFLOXACIN 500 MG PO TABS: 500.0000 mg | ORAL_TABLET | Freq: Every day | ORAL | Status: DC

## 2015-06-21 NOTE — Addendum Note (Signed)
Addended by: Marin Olp on: 06/21/2015 06:03 PM   Modules accepted: Miquel Dunn

## 2015-06-21 NOTE — Progress Notes (Signed)
Subjective:  Patient ID: Ruth Rivers, female    DOB: [birth year]  Age: 40 y.o. MRN: [id number]  CC: URI   HPI Ruth Rivers presents for head and chest congestion, low grade fever, cough, started 4 days ago.  History Ruth Rivers has a past medical history of Cellulitis; Depression; Insomnia; Anxiety; Obesity; Adenomatous colon polyp; and Gastritis.   She has past surgical history that includes Cholecystectomy (10/1998); Tonsillectomy; Cesarean section; Colonoscopy w/ biopsies; Breast reduction surgery (2001); Colonoscopy (2013); and Polypectomy (2013).   Her family history includes Breast cancer in her other; Colon cancer in her maternal aunt; Colon polyps in her father; Other in her cousin. There is no history of Esophageal cancer, Rectal cancer, Stomach cancer, or Pancreatic cancer.She reports that she has never smoked. She has never used smokeless tobacco. She reports that she does not drink alcohol or use illicit drugs.    ROS Review of Systems  Constitutional: Negative for fever, chills, activity change and appetite change.  HENT: Positive for congestion, postnasal drip, rhinorrhea and sinus pressure. Negative for ear discharge, ear pain, hearing loss, nosebleeds, sneezing and trouble swallowing.   Respiratory: Negative for chest tightness and shortness of breath.   Cardiovascular: Negative for chest pain and palpitations.  Skin: Negative for rash.    Objective:  Pulse 90  Temp(Src) 97.8 F (36.6 C) (Oral)  Ht 5\' 2"  (1.575 m)  Wt 252 lb 9.6 oz (114.579 kg)  BMI 46.19 kg/m2  SpO2 99%  LMP 06/16/2015  BP Readings from Last 3 Encounters:  05/03/15 99/65  03/21/15 108/54  02/14/15 113/73    Wt Readings from Last 3 Encounters:  06/21/15 252 lb 9.6 oz (114.579 kg)  05/03/15 257 lb 6.4 oz (116.756 kg)  03/21/15 254 lb (115.214 kg)     Physical Exam  Constitutional: She appears well-developed and well-nourished.  HENT:  Head: Normocephalic and atraumatic.  Right Ear:  Tympanic membrane and external ear normal. No decreased hearing is noted.  Left Ear: Tympanic membrane and external ear normal. No decreased hearing is noted.  Nose: Mucosal edema present. Right sinus exhibits no frontal sinus tenderness. Left sinus exhibits no frontal sinus tenderness.  Mouth/Throat: No oropharyngeal exudate or posterior oropharyngeal erythema.  Neck: No Brudzinski's sign noted.  Pulmonary/Chest: Breath sounds normal. No respiratory distress.  Lymphadenopathy:       Head (right side): No preauricular adenopathy present.       Head (left side): No preauricular adenopathy present.       Right cervical: No superficial cervical adenopathy present.      Left cervical: No superficial cervical adenopathy present.     Lab Results  Component Value Date   WBC 11.2* 02/14/2015   HGB 13.7 11/18/2012   HCT 40.7 02/14/2015   PLT 312 02/14/2015   GLUCOSE 83 02/14/2015   CHOL 196 02/14/2015   TRIG 84 02/14/2015   HDL 64 02/14/2015   LDLCALC 115* 02/14/2015   ALT 12 02/14/2015   AST 18 02/14/2015   NA 136 02/14/2015   K 4.5 02/14/2015   CL 95* 02/14/2015   CREATININE 0.73 02/14/2015   BUN 11 02/14/2015   CO2 24 02/14/2015   TSH 2.500 02/14/2015    Dg Chest 2 View  12/07/2011  *RADIOLOGY REPORT* Clinical Data: Fever 4 days status post colonoscopy/endoscopy CHEST - 2 VIEW Comparison: Most recent prior chest x-ray 11/04/2011 Findings: The lungs are well-aerated and free from pulmonary edema, focal airspace consolidation or pulmonary nodule.  Cardiac and mediastinal contours are within  normal limits.  No pneumothorax, or pleural effusion. No acute osseous findings. Surgical clips in the right upper quadrant suggest prior cholecystectomy. IMPRESSION: No acute cardiopulmonary disease. Original Report Authenticated By: Jacqulynn Cadet, M.D.    Assessment & Plan:   Anajah was seen today for uri.  Diagnoses and all orders for this visit:  Sinobronchitis -     betamethasone  acetate-betamethasone sodium phosphate (CELESTONE) injection 6 mg; Inject 1 mL (6 mg total) into the muscle once.  Other orders -     levofloxacin (LEVAQUIN) 500 MG tablet; Take 1 tablet (500 mg total) by mouth daily. For 10 days -     HYDROcodone-homatropine (HYCODAN) 5-1.5 MG/5ML syrup; Take 5 mLs by mouth every 6 (six) hours as needed for cough.     I have discontinued Ms. Hayton's Vitamin D (Ergocalciferol), Melatonin, cyclobenzaprine, and traMADol. I am also having her start on levofloxacin and HYDROcodone-homatropine. Additionally, I am having her maintain her clonazePAM, buPROPion, vortioxetine HBr, traZODone, and calcium-vitamin D. We will continue to administer betamethasone acetate-betamethasone sodium phosphate.  Meds ordered this encounter  Medications  . levofloxacin (LEVAQUIN) 500 MG tablet    Sig: Take 1 tablet (500 mg total) by mouth daily. For 10 days    Dispense:  10 tablet    Refill:  0  . HYDROcodone-homatropine (HYCODAN) 5-1.5 MG/5ML syrup    Sig: Take 5 mLs by mouth every 6 (six) hours as needed for cough.    Dispense:  120 mL    Refill:  0  . betamethasone acetate-betamethasone sodium phosphate (CELESTONE) injection 6 mg    Sig:      Follow-up: Return if symptoms worsen or fail to improve.  Claretta Fraise, M.D.

## 2015-06-27 ENCOUNTER — Telehealth: Payer: Self-pay | Admitting: Family Medicine

## 2015-06-28 NOTE — Telephone Encounter (Signed)
The requested med has been called to the pharmacy.  Please let the patient know. Thanks, WS

## 2015-06-29 NOTE — Telephone Encounter (Signed)
Left message, cough medication called in per Dr. Livia Snellen.

## 2015-09-22 ENCOUNTER — Ambulatory Visit (INDEPENDENT_AMBULATORY_CARE_PROVIDER_SITE_OTHER): Payer: BC Managed Care – PPO | Admitting: Family Medicine

## 2015-09-22 ENCOUNTER — Encounter: Payer: Self-pay | Admitting: Family Medicine

## 2015-09-22 DIAGNOSIS — R413 Other amnesia: Secondary | ICD-10-CM | POA: Diagnosis not present

## 2015-09-22 DIAGNOSIS — J309 Allergic rhinitis, unspecified: Secondary | ICD-10-CM

## 2015-09-22 NOTE — Progress Notes (Signed)
BP 118/76   Pulse 81   Temp 97.9 F (36.6 C) (Oral)   Ht 5\' 2"  (1.575 m)   Wt 257 lb 6.4 oz (116.8 kg)   BMI 47.08 kg/m    Subjective:    Patient ID: Ruth Rivers, female    DOB: 1975-04-29, 40 y.o.   MRN: [id number]  HPI: Ruth Rivers is a 40 y.o. female presenting on 09/22/2015 for Memory problems, not thinking clearly, "memory fog" (had began to have issues where she felt she wasn't thinking clearly, could not complete simple tasks; looked up possible causes and read that Klonopin could cause this - stopped this medication 3 weeks ago and has seen some improvement); Nausea, sinus drainage (woke up this morning with sinus drainage and feeling nauseous); and Discuss efforts to lose weight   HPI Memory issues Patient is coming in today because she has been having increased memory issues over the past few months. She feels like she is in a haze or in a fog and cannot remember things. She feels like she is forgetting how to do things that she usually does on a day-to-day basis like make some ID cards for her class. She says that she has stopped the Klonopin about 3 weeks ago and has been doing better with memory issues and feels like it's improved on a day-to-day basis over this week. She will talk to her psychiatrist about this and then follow-up further if needed with [place]. She says she takes a B complex vitamin. She says she is not sleeping well at night and often wakes up still fatigued which may be a contributing factor to her memory as well.  Sinus congestion and nasal congestion Patient has been having sinus congestion and nasal congestion has been going on for the past few days. 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 She denies any shortness of breath or wheezing or fevers or chills. She has had a little bit of a sore throat and a touch of a cough  but is been nonproductive. She mostly wakes up in the morning been more congested than she was previously.  Morbid obesity and weight loss Patient comes in wanting to discuss morbid obesity and weight loss. She feels like over the past month and a half since her husband was diagnosed with diabetes they have made a lot of dietary changes and the family and she has been doing a lot better by cutting out soda and increasing green vegetables and using only lean meats but she does admit that she still drinks quite a bit of sweet tea. Her husband has started losing weight but she has not yet she was concerned about that.  Relevant past medical, surgical, family and social history reviewed and updated as indicated. Interim medical history since our last visit reviewed. Allergies and medications reviewed and updated.  Review of Systems  Constitutional: Positive for fatigue. Negative for chills and fever.  HENT: Positive for postnasal drip, rhinorrhea, sinus pressure, sneezing and sore throat. Negative for congestion, ear discharge and ear pain.   Eyes: Negative for pain, redness and visual disturbance.  Respiratory: Negative for chest tightness and shortness of breath.   Cardiovascular: Negative for chest pain and leg swelling.  Genitourinary: Negative for difficulty urinating and dysuria.  Musculoskeletal: Negative for back pain and gait problem.  Skin:  Negative for rash.  Neurological: Negative for light-headedness and headaches.  Psychiatric/Behavioral: Positive for decreased concentration and sleep disturbance. Negative for agitation, behavioral problems, self-injury and suicidal ideas.  All other systems reviewed and are negative.   Per HPI unless specifically indicated above     Medication List       Accurate as of 09/22/15  3:17 PM. Always use your most recent med list.          BRINTELLIX 10 MG Tabs Generic drug:  vortioxetine HBr Take 10 mg by mouth 2 (two) times daily.     buPROPion 300 MG 24 hr tablet Commonly known as:  WELLBUTRIN XL   calcium-vitamin D 500-200 MG-UNIT tablet Commonly known as:  OSCAL WITH D Take 1 tablet by mouth daily.   clonazePAM 1 MG tablet Commonly known as:  KLONOPIN Take 0.5 mg by mouth as needed.   traZODone 50 MG tablet Commonly known as:  DESYREL Take 50 mg by mouth at bedtime. 2 -3 tablets at bedtime          Objective:    BP 118/76   Pulse 81   Temp 97.9 F (36.6 C) (Oral)   Ht 5\' 2"  (1.575 m)   Wt 257 lb 6.4 oz (116.8 kg)   BMI 47.08 kg/m   Wt Readings from Last 3 Encounters:  09/22/15 257 lb 6.4 oz (116.8 kg)  06/21/15 252 lb 9.6 oz (114.6 kg)  05/03/15 257 lb 6.4 oz (116.8 kg)    Physical Exam  Constitutional: She is oriented to person, place, and time. She appears well-developed and well-nourished. No distress.  HENT:  Right Ear: Tympanic membrane, external ear and ear canal normal.  Left Ear: Tympanic membrane, external ear and ear canal normal.  Nose: Mucosal edema and rhinorrhea present. No epistaxis. Right sinus exhibits no maxillary sinus tenderness and no frontal sinus tenderness. Left sinus exhibits no maxillary sinus tenderness and no frontal sinus tenderness.  Mouth/Throat: Uvula is midline and mucous membranes are normal. Posterior oropharyngeal edema and posterior oropharyngeal erythema present. No oropharyngeal exudate or tonsillar abscesses.  Eyes: Conjunctivae are normal. Right eye exhibits no discharge. Left eye exhibits no discharge.  Neck: Neck supple. No thyromegaly present.  Cardiovascular: Normal rate, regular rhythm, normal heart sounds and intact distal pulses.   No murmur heard. Pulmonary/Chest: Effort normal and breath sounds normal. No respiratory distress. She has no wheezes.  Musculoskeletal: Normal range of motion. She exhibits no edema or tenderness.  Lymphadenopathy:    She has no cervical adenopathy.  Neurological: She is alert and oriented to person, place, and time.  Coordination normal.  Skin: Skin is warm and dry. No rash noted. She is not diaphoretic.  Psychiatric: She has a normal mood and affect. Her behavior is normal. Judgment and thought content normal.  Nursing note and vitals reviewed.     Assessment & Plan:   Problem List Items Addressed This Visit      Other   Obesity - Primary    Other Visit Diagnoses    Memory changes       Patient does have issues with sleep and she also was taking Klonopin, stop Klonopin is doing better, recommended continued trying to do sleep hygiene   Allergic rhinitis, unspecified allergic rhinitis type       Recommended over-the-counter antihistamine and nasal saline's and Flonase       Follow up plan: Return in about 4 months (around 01/22/2016), or if symptoms worsen or fail to improve, for  Adult well exam.  Counseling provided for all of the vaccine components Orders Placed This Encounter  Procedures  . Referral to Nutrition and Diabetes [place] Milady Fleener, MD [facility] 09/22/2015, 3:17 PM

## 2015-11-01 ENCOUNTER — Encounter: Payer: BC Managed Care – PPO | Attending: Family Medicine | Admitting: Nutrition

## 2015-11-01 DIAGNOSIS — Z713 Dietary counseling and surveillance: Secondary | ICD-10-CM | POA: Insufficient documentation

## 2015-11-01 NOTE — Progress Notes (Signed)
  Medical Nutrition Therapy:  Appt start time: 1600 end time:  1700.   Assessment:  Primary concerns today: Obesity. .  Gained a lot of weight in the past on Seroquel. BM 47. Wants to lose weight. Eats 2-3 meals per day. Not exercising much. Eats a lot of fast foods and processed foods. Emotionally eating.  Diet is excessive to meet her needs causing weight gain. Also may be a side of medications.   Preferred Learning Style:    No preference indicated   Learning Readiness:  Ready  Change in progress   MEDICATIONS:  See list    DIETARY INTAKE:  24-hr recall:  B ( AM): Sausage biscuit, water  Snk ( AM): nothing  L ( PM): LUnch at school,water Snk ( PM): none D ( PM): Cheese and crackers or fast food or processed food.  Snk ( PM): misc; chocolate or sweet Beverages: water  Usual physical activity: ADL; walking   Estimated energy needs: 1500 calories 170 g carbohydrates 112 g protein 42 g fat  Progress Towards Goal(s):  In progress.   Nutritional Diagnosis:  NI-1.5 Excessive energy intake As related to Obesity.  As evidenced by BMI 47.    Intervention:  Plate Method, portion sizes, Balanced meals, need for exercise, avoiding processed foods, salt and empty calories, High Fiber, Low Salt Low Fat Diet. Risk for DM. Benefits of exericse  Goals 1. Follow My Plate 2. Buy fresh or frozen and avoid processed foods 3. Plan meals ahead 4. Eat meals a day on time. Avoid snacks meal Use Phone app to track food Lose 1-2 lbs per week.  Teaching Method Utilized:  Visual Auditory Hands on  Handouts given during visit include:  The Plate Method  Healthy Weight loss tips   Barriers to learning/adherence to lifestyle change:  None  Demonstrated degree of understanding via:  Teach Back   Monitoring/Evaluation:  Dietary intake, exercise, meal planning, food journal, and body weight in 1 month(s).

## 2015-11-01 NOTE — Patient Instructions (Addendum)
Goals 1. Follow My Plate 2. Buy fresh or frozen and avoid processed foods 3. Plan meals ahead 4. Eat meals a day on time. Avoid snacks meal Use Phone app to track food Lose 1-2 lbs per week.

## 2015-11-17 ENCOUNTER — Ambulatory Visit (INDEPENDENT_AMBULATORY_CARE_PROVIDER_SITE_OTHER): Payer: BC Managed Care – PPO | Admitting: Family Medicine

## 2015-11-17 ENCOUNTER — Telehealth: Payer: Self-pay | Admitting: Family Medicine

## 2015-11-17 ENCOUNTER — Encounter: Payer: Self-pay | Admitting: *Deleted

## 2015-11-17 ENCOUNTER — Encounter: Payer: Self-pay | Admitting: Family Medicine

## 2015-11-17 VITALS — BP 96/69 | HR 78 | Temp 97.6°F | Ht 62.0 in | Wt 251.2 lb

## 2015-11-17 DIAGNOSIS — F05 Delirium due to known physiological condition: Secondary | ICD-10-CM | POA: Diagnosis not present

## 2015-11-17 DIAGNOSIS — F418 Other specified anxiety disorders: Secondary | ICD-10-CM | POA: Diagnosis not present

## 2015-11-17 DIAGNOSIS — F32A Depression, unspecified: Secondary | ICD-10-CM

## 2015-11-17 DIAGNOSIS — R413 Other amnesia: Secondary | ICD-10-CM

## 2015-11-17 DIAGNOSIS — F329 Major depressive disorder, single episode, unspecified: Secondary | ICD-10-CM

## 2015-11-17 DIAGNOSIS — F419 Anxiety disorder, unspecified: Secondary | ICD-10-CM

## 2015-11-17 LAB — BAYER DCA HB A1C WAIVED: HB A1C: 4.7 % (ref <7.0)

## 2015-11-17 NOTE — Telephone Encounter (Signed)
Spoke with pt's husband Pt had episode earlier of forgetting where meeting was or how to get to room where meeting was being held Pt had to be guided Pt has had other episodes in the past Offered appt  Husband wanted appt scheduled for afternoon Husband instructed to take pt to ED if sxs worsen before appt

## 2015-11-17 NOTE — Patient Instructions (Signed)
We will ask you to remain out of work through next week Dr. Jannifer Franklin from [facility] neurology will be given you call to make arrangements to see you in his office We will ask that you reduce the trazodone to no more than 100 mg daily. Stay on the Wellbutrin as doing Stay on the trintellix as doing at 10 mg daily We will be scheduling you for an MRI of the brain with contrast Our nurse will get this scheduled as soon as possible If you do not hear from our office or Dr. Jannifer Franklin office by early Tuesday morning, please give [place] a call We will call you with lab work results and MRI results as soon as those results become available Dr. Jannifer Franklin will have copies of all of this information when you see him

## 2015-11-17 NOTE — Progress Notes (Signed)
Subjective:    Patient ID: Ruth Rivers, female    DOB: Feb 06, 1975, 40 y.o.   MRN: [id number]  HPI Patient here today for confusion / altered mental status. This has been going on for over 1 year. She had a CT of her head in September of 2017.The patient denies any chest pain or shortness of breath. She is concerned about her weight and is working consciously on her weight to lose weight. She is following her diet much more closely. She denies any trouble with her stomach including nausea heartburn indigestion vomiting blood in the stool or black tarry bowel movements. She is passing her water without problems. She teaches the first grade. Today she became so confused at work that she was unable to complete work on the computer that she has been doing for over 5 years. The principal became aware of this after another teacher reported her to the prednisone may ask her husband to come pick her up from school. She has been seeing the [facility] and as of today she is taking 300 mg of wellButran up to 150 mg of trazodone and 10 mg of trintellix. She was on clonazepam but stopped this on her own accord in August. She has city water as she [place] in [place]. She is the mother of one. Her husband also teaches school. Her family history has 2 parents that are healthy 1 her father at 68 and her mother at 18 and have no health issues. She has 1 brother that is alive and well and has ADHD and is an Chief Financial Officer. She does complain of some slight headache at times and the headaches are generally front back sides etc. She's had a recent eye exam and this was normal.   Patient Active Problem List   Diagnosis Date Noted  . PUD (peptic ulcer disease) 05/03/2015  . Insomnia 02/03/2015  . Nausea with vomiting 08/30/2014  . Gastritis 01/02/2012  . Hx of adenomatous colonic polyps 01/02/2012  . Anxiety and depression 11/23/2011  . Obesity 11/23/2011   Outpatient Encounter Prescriptions as  of 11/17/2015  Medication Sig  . buPROPion (WELLBUTRIN XL) 300 MG 24 hr tablet   . calcium-vitamin D (OSCAL WITH D) 500-200 MG-UNIT tablet Take 1 tablet by mouth daily.  . traZODone (DESYREL) 50 MG tablet Take 50 mg by mouth at bedtime. 2 -3 tablets at bedtime  . Vortioxetine HBr (BRINTELLIX) 10 MG TABS Take 10 mg by mouth 2 (two) times daily.   . clonazePAM (KLONOPIN) 1 MG tablet Take 0.5 mg by mouth as needed.    No facility-administered encounter medications on file as of 11/17/2015.       Review of Systems  Constitutional: Negative.   HENT: Negative.   Eyes: Negative.   Respiratory: Negative.   Cardiovascular: Negative.   Gastrointestinal: Negative.   Endocrine: Negative.   Genitourinary: Negative.   Musculoskeletal: Negative.   Skin: Negative.   Allergic/Immunologic: Negative.   Neurological: Negative.   Hematological: Negative.   Psychiatric/Behavioral: Positive for confusion.       Objective:   Physical Exam  Constitutional: She is oriented to person, place, and time. She appears well-developed and well-nourished. She appears distressed.  Patient was pleasant and anxious at the same time and comes to the visit today with her husband. She was alert in the exam room. She is very concerned about her memory issues and what could be going on.  HENT:  Head: Normocephalic and atraumatic.  Right Ear: External ear  normal.  Left Ear: External ear normal.  Nose: Nose normal.  Mouth/Throat: Oropharynx is clear and moist. No oropharyngeal exudate.  Eyes: Conjunctivae and EOM are normal. Pupils are equal, round, and reactive to light. Right eye exhibits no discharge. Left eye exhibits no discharge. No scleral icterus.  Neck: Normal range of motion. Neck supple. No thyromegaly present.  Cardiovascular: Normal rate, regular rhythm, normal heart sounds and intact distal pulses.   No murmur heard. Heart was regular at 72/m  Pulmonary/Chest: Effort normal and breath sounds normal. No  respiratory distress. She has no wheezes. She has no rales.  Clear anteriorly and posteriorly  Abdominal: Soft. Bowel sounds are normal. She exhibits no mass. There is no tenderness. There is no rebound and no guarding.  Morbid obesity without any masses tenderness or organ enlargement  Musculoskeletal: Normal range of motion. She exhibits no edema.  Lymphadenopathy:    She has no cervical adenopathy.  Neurological: She is alert and oriented to person, place, and time. She has normal reflexes. No cranial nerve deficit.  Good strength bilaterally  Skin: Skin is warm and dry. No rash noted.  Psychiatric: She has a normal mood and affect. Her behavior is normal. Judgment and thought content normal.  Nursing note and vitals reviewed.  BP 96/69 (BP Location: Left Arm, Patient Position: Sitting, Cuff Size: Large)   Pulse 78   Temp 97.6 F (36.4 C) (Oral)   Ht '5\' 2"'  (1.575 m)   Wt 251 lb 3.2 oz (113.9 kg)   LMP 11/03/2015   BMI 45.95 kg/m   I did speak with neurologist and he agreed that we should get an MRI with contrast of the brain. He will be calling the patient with an appointment and we will make sure that lab work is sent to him to review also.      Assessment & Plan:  1. Acute confusional state - CBC with Differential/Platelet - BMP8+EGFR - Thyroid Panel With TSH - Hepatic function panel - Vitamin B12 - Bayer DCA Hb A1c Waived - MR BRAIN W CONTRAST; Future  2. Morbid obesity, unspecified obesity type ([place]) -Continue with diet efforts  3. Memory changes -Appointment with neurology for further evaluation -Remain out of work at least through next week until we resolve the issues are causing the problems with your confusion  4. Anxiety and depression -Reduce trazodone 200 mg daily, 150 mg daily -For the time being continue with Wellbutrin 300 mg daily -Continue with Trintellix 10 mg daily -Do not take any more clonazepam as she has been off of this since August  Patient  Instructions  We will ask you to remain out of work through next week Dr. Jannifer Franklin from [facility] neurology will be given you call to make arrangements to see you in his office We will ask that you reduce the trazodone to no more than 100 mg daily. Stay on the Wellbutrin as doing Stay on the trintellix as doing at 10 mg daily We will be scheduling you for an MRI of the brain with contrast Our nurse will get this scheduled as soon as possible If you do not hear from our office or Dr. Jannifer Franklin office by early Tuesday morning, please give [place] a call We will call you with lab work results and MRI results as soon as those results become available Dr. Jannifer Franklin will have copies of all of this information when you see him  Arrie Senate MD

## 2015-11-18 LAB — BMP8+EGFR
BUN/Creatinine Ratio: 13 (ref 9–23)
BUN: 10 mg/dL (ref 6–24)
CHLORIDE: 97 mmol/L (ref 96–106)
CO2: 25 mmol/L (ref 18–29)
CREATININE: 0.79 mg/dL (ref 0.57–1.00)
Calcium: 9.4 mg/dL (ref 8.7–10.2)
GFR calc Af Amer: 107 mL/min/{1.73_m2} (ref >59)
GFR calc non Af Amer: 93 mL/min/{1.73_m2} (ref >59)
GLUCOSE: 78 mg/dL (ref 65–99)
Potassium: 4.1 mmol/L (ref 3.5–5.2)
SODIUM: 139 mmol/L (ref 134–144)

## 2015-11-18 LAB — CBC WITH DIFFERENTIAL/PLATELET
BASOS ABS: 0.1 10*3/uL (ref 0.0–0.2)
Basos: 1 %
EOS (ABSOLUTE): 0.2 10*3/uL (ref 0.0–0.4)
Eos: 2 %
HEMOGLOBIN: 14.2 g/dL (ref 11.1–15.9)
Hematocrit: 41.2 % (ref 34.0–46.6)
IMMATURE GRANS (ABS): 0.1 10*3/uL (ref 0.0–0.1)
Immature Granulocytes: 1 %
LYMPHS: 23 %
Lymphocytes Absolute: 2.1 10*3/uL (ref 0.7–3.1)
MCH: 31.1 pg (ref 26.6–33.0)
MCHC: 34.5 g/dL (ref 31.5–35.7)
MCV: 90 fL (ref 79–97)
MONOCYTES: 11 %
Monocytes Absolute: 1 10*3/uL — ABNORMAL HIGH (ref 0.1–0.9)
NEUTROS PCT: 62 %
Neutrophils Absolute: 5.8 10*3/uL (ref 1.4–7.0)
PLATELETS: 288 10*3/uL (ref 150–379)
RBC: 4.57 x10E6/uL (ref 3.77–5.28)
RDW: 12.5 % (ref 12.3–15.4)
WBC: 9.3 10*3/uL (ref 3.4–10.8)

## 2015-11-18 LAB — VITAMIN B12: VITAMIN B 12: 311 pg/mL (ref 211–946)

## 2015-11-18 LAB — THYROID PANEL WITH TSH
FREE THYROXINE INDEX: 1.5 (ref 1.2–4.9)
T3 Uptake Ratio: 24 % (ref 24–39)
T4 TOTAL: 6.4 ug/dL (ref 4.5–12.0)
TSH: 2.3 u[IU]/mL (ref 0.450–4.500)

## 2015-11-18 LAB — HEPATIC FUNCTION PANEL
ALK PHOS: 63 IU/L (ref 39–117)
ALT: 22 IU/L (ref 0–32)
AST: 15 IU/L (ref 0–40)
Albumin: 4.5 g/dL (ref 3.5–5.5)
BILIRUBIN, DIRECT: 0.1 mg/dL (ref 0.00–0.40)
Bilirubin Total: 0.2 mg/dL (ref 0.0–1.2)
TOTAL PROTEIN: 7.3 g/dL (ref 6.0–8.5)

## 2015-11-21 ENCOUNTER — Other Ambulatory Visit: Payer: Self-pay

## 2015-11-21 DIAGNOSIS — F05 Delirium due to known physiological condition: Secondary | ICD-10-CM

## 2015-11-22 ENCOUNTER — Ambulatory Visit (HOSPITAL_COMMUNITY)
Admission: RE | Admit: 2015-11-22 | Discharge: 2015-11-22 | Disposition: A | Discharge status: Home / Self Care | Payer: BC Managed Care – PPO | Source: Ambulatory Visit | Attending: Family Medicine | Admitting: Family Medicine

## 2015-11-22 ENCOUNTER — Other Ambulatory Visit: Payer: Self-pay

## 2015-11-22 ENCOUNTER — Other Ambulatory Visit: Payer: Self-pay | Admitting: Nurse Practitioner

## 2015-11-22 DIAGNOSIS — F05 Delirium due to known physiological condition: Secondary | ICD-10-CM | POA: Insufficient documentation

## 2015-11-24 ENCOUNTER — Ambulatory Visit: Payer: BC Managed Care – PPO | Admitting: Family Medicine

## 2015-11-24 ENCOUNTER — Telehealth: Payer: Self-pay | Admitting: Family Medicine

## 2015-12-13 ENCOUNTER — Ambulatory Visit: Payer: Self-pay | Admitting: Nutrition

## 2015-12-20 ENCOUNTER — Ambulatory Visit (INDEPENDENT_AMBULATORY_CARE_PROVIDER_SITE_OTHER): Payer: BC Managed Care – PPO | Admitting: Family Medicine

## 2015-12-20 ENCOUNTER — Encounter: Payer: Self-pay | Admitting: Family Medicine

## 2015-12-20 VITALS — BP 130/83 | HR 86 | Temp 98.6°F | Resp 20 | Ht 62.0 in | Wt 250.0 lb

## 2015-12-20 DIAGNOSIS — J0101 Acute recurrent maxillary sinusitis: Secondary | ICD-10-CM | POA: Diagnosis not present

## 2015-12-20 MED ORDER — PSEUDOEPHEDRINE-GUAIFENESIN ER 120-1200 MG PO TB12: 1.0000 | ORAL_TABLET | Freq: Two times a day (BID) | ORAL | 0 refills | Status: DC

## 2015-12-20 MED ORDER — LEVOFLOXACIN 500 MG PO TABS: 500.0000 mg | ORAL_TABLET | Freq: Every day | ORAL | 0 refills | Status: DC

## 2015-12-20 NOTE — Progress Notes (Signed)
Subjective:  Patient ID: Ruth Rivers, female    DOB: 1975-12-17  Age: 40 y.o. MRN: [id number]  CC: Cough and Nasal Congestion   HPI Ruth Rivers presents for Symptoms include congestion, facial pain, nasal congestion, no  fever, non productive cough, post nasal drip and sinus pressure with no fever, chills, night sweats or weight loss. Onset of symptoms was about 2 days ago, gradually worsening since that time. Drainage is better  History Ruth Rivers has a past medical history of Adenomatous colon polyp; Anxiety; Cellulitis; Depression; Gastritis; Insomnia; and Obesity.   She has a past surgical history that includes Cholecystectomy (10/1998); Tonsillectomy; Cesarean section; Colonoscopy w/ biopsies; Breast reduction surgery (2001); Colonoscopy (2013); and Polypectomy (2013).   Her family history includes Breast cancer in her other; Colon cancer in her maternal aunt; Colon polyps in her father; Other in her cousin.She reports that she has never smoked. She has never used smokeless tobacco. She reports that she does not drink alcohol or use drugs.  Current Outpatient Prescriptions on File Prior to Visit  Medication Sig Dispense Refill  . buPROPion (WELLBUTRIN XL) 300 MG 24 hr tablet     . calcium-vitamin D (OSCAL WITH D) 500-200 MG-UNIT tablet Take 1 tablet by mouth daily.    . clonazePAM (KLONOPIN) 1 MG tablet Take 0.5 mg by mouth as needed.     . traZODone (DESYREL) 50 MG tablet Take 50 mg by mouth at bedtime. 2 -3 tablets at bedtime    . Vortioxetine HBr (BRINTELLIX) 10 MG TABS Take 5 mg by mouth 2 (two) times daily.      No current facility-administered medications on file prior to visit.     ROS Review of Systems  Constitutional: Negative for activity change, appetite change, chills and fever.  HENT: Positive for congestion, postnasal drip, rhinorrhea and sinus pressure. Negative for ear discharge, ear pain, hearing loss, nosebleeds, sneezing and trouble swallowing.   Respiratory:  Negative for chest tightness and shortness of breath.   Cardiovascular: Negative for chest pain and palpitations.  Skin: Negative for rash.    Objective:  BP 130/83   Pulse 86   Temp 98.6 F (37 C) (Oral)   Resp 20   Ht 5\' 2"  (1.575 m)   Wt 250 lb (113.4 kg)   LMP 11/16/2015   SpO2 97%   BMI 45.73 kg/m   Physical Exam  Constitutional: She appears well-developed and well-nourished.  HENT:  Head: Normocephalic and atraumatic.  Right Ear: Tympanic membrane and external ear normal. No decreased hearing is noted.  Left Ear: Tympanic membrane and external ear normal. No decreased hearing is noted.  Nose: Mucosal edema present. Right sinus exhibits no frontal sinus tenderness. Left sinus exhibits no frontal sinus tenderness.  Mouth/Throat: No oropharyngeal exudate or posterior oropharyngeal erythema.  Neck: No Brudzinski's sign noted.  Pulmonary/Chest: Breath sounds normal. No respiratory distress.  Lymphadenopathy:       Head (right side): No preauricular adenopathy present.       Head (left side): No preauricular adenopathy present.       Right cervical: No superficial cervical adenopathy present.      Left cervical: No superficial cervical adenopathy present.    Assessment & Plan:   [name] was seen today for cough and nasal congestion.  Diagnoses and all orders for this visit:  Acute recurrent maxillary sinusitis  Other orders -     levofloxacin (LEVAQUIN) 500 MG tablet; Take 1 tablet (500 mg total) by mouth daily. For 10 days -  Pseudoephedrine-Guaifenesin [id number] MG TB12; Take 1 tablet by mouth 2 (two) times daily. For congestion   I am having Ms. Reddington start on levofloxacin and Pseudoephedrine-Guaifenesin. I am also having her maintain her clonazePAM, buPROPion, vortioxetine HBr, traZODone, and calcium-vitamin D.  Meds ordered this encounter  Medications  . levofloxacin (LEVAQUIN) 500 MG tablet    Sig: Take 1 tablet (500 mg total) by mouth daily. For 10 days     Dispense:  10 tablet    Refill:  0  . Pseudoephedrine-Guaifenesin [id number] MG TB12    Sig: Take 1 tablet by mouth 2 (two) times daily. For congestion    Dispense:  20 each    Refill:  0     Follow-up: Return if symptoms worsen or fail to improve.  Claretta Fraise, M.D.

## 2016-01-11 ENCOUNTER — Telehealth: Payer: Self-pay

## 2016-01-11 NOTE — Telephone Encounter (Signed)
Per Dr. Tawanna Sat note she was to get a neurology referral with Dr. Jannifer Franklin or one of his associates at [facility] neurology

## 2016-01-12 NOTE — Telephone Encounter (Signed)
LM for pt --- [facility] had spoke to Dr Jannifer Franklin on the day this pt was seen - we were to get the MRI and his office would call the pt with appt.

## 2016-01-12 NOTE — Telephone Encounter (Signed)
Do you know anything about this? 

## 2016-03-01 ENCOUNTER — Encounter: Payer: Self-pay | Admitting: Family Medicine

## 2016-03-01 ENCOUNTER — Ambulatory Visit (INDEPENDENT_AMBULATORY_CARE_PROVIDER_SITE_OTHER): Payer: BC Managed Care – PPO | Admitting: Family Medicine

## 2016-03-01 VITALS — BP 101/71 | HR 72 | Temp 98.6°F | Ht 62.0 in | Wt 243.4 lb

## 2016-03-01 DIAGNOSIS — J0110 Acute frontal sinusitis, unspecified: Secondary | ICD-10-CM

## 2016-03-01 MED ORDER — AZITHROMYCIN 250 MG PO TABS: ORAL_TABLET | ORAL | 0 refills | Status: DC

## 2016-03-01 MED ORDER — HYDROCODONE-HOMATROPINE 5-1.5 MG/5ML PO SYRP: 5.0000 mL | ORAL_SOLUTION | Freq: Three times a day (TID) | ORAL | 0 refills | Status: DC | PRN

## 2016-03-01 NOTE — Progress Notes (Signed)
   Subjective:    Patient ID: Ruth Rivers, female    DOB: 12-Nov-1975, 41 y.o.   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 Complaints today are of a productive cough with some green sputum. She has had headache for 1 week runny nose sore throat intermittently. She feels tired and has had fever. She has tried over-the-counter meds including Sudafed NyQuil and Robitussin.  Patient Active Problem List   Diagnosis Date Noted  . PUD (peptic ulcer disease) 05/03/2015  . Insomnia 02/03/2015  . Nausea with vomiting 08/30/2014  . Gastritis 01/02/2012  . Hx of adenomatous colonic polyps 01/02/2012  . Anxiety and depression 11/23/2011  . Obesity 11/23/2011   Outpatient Encounter Prescriptions as of 03/01/2016  Medication Sig  . buPROPion (WELLBUTRIN XL) 300 MG 24 hr tablet   . calcium-vitamin D (OSCAL WITH D) 500-200 MG-UNIT tablet Take 1 tablet by mouth daily.  . clonazePAM (KLONOPIN) 1 MG tablet Take 0.5 mg by mouth as needed.   . traZODone (DESYREL) 50 MG tablet Take 50 mg by mouth at bedtime. 2 -3 tablets at bedtime  . Vortioxetine HBr (BRINTELLIX) 10 MG TABS Take 5 mg by mouth 2 (two) times daily.   . [DISCONTINUED] levofloxacin (LEVAQUIN) 500 MG tablet Take 1 tablet (500 mg total) by mouth daily. For 10 days  . [DISCONTINUED] Pseudoephedrine-Guaifenesin [phone] MG TB12 Take 1 tablet by mouth 2 (two) times daily. For congestion   No facility-administered encounter medications on file as of 03/01/2016.      Review of Systems  Constitutional: Positive for fatigue and fever.  Respiratory: Positive for cough.   Neurological: Positive for headaches.       Objective:   Physical Exam  Constitutional: She is oriented to person, place, and time. She appears well-developed and well-nourished.  HENT:  Mouth/Throat: Oropharynx is clear and moist.  There is tenderness in the frontal sinus area with  percussion  Cardiovascular: Normal rate and regular rhythm.   Pulmonary/Chest: Effort normal and breath sounds normal.  Musculoskeletal: Normal range of motion.  Neurological: She is alert and oriented to person, place, and time.   BP 101/71   Pulse 72   Temp 98.6 F (37 C) (Oral)   Ht 5\' 2"  (1.575 m)   Wt 243 lb 6.4 oz (110.4 kg)   BMI 44.52 kg/m         Assessment & Plan:  1. Acute frontal sinusitis, recurrence not specified Since she is coughing up green sputum and her lungs are clear I presume the sputum is drainage from her sinuses. Disc also explained sore throat. Will treat with Zithromax and Hycodan as cough suppressant to take especially at nighttime.  Wardell Honour MD

## 2016-05-02 ENCOUNTER — Ambulatory Visit (INDEPENDENT_AMBULATORY_CARE_PROVIDER_SITE_OTHER): Payer: BC Managed Care – PPO | Admitting: Physician Assistant

## 2016-05-02 ENCOUNTER — Encounter: Payer: Self-pay | Admitting: Physician Assistant

## 2016-05-02 VITALS — BP 94/60 | HR 79 | Temp 97.4°F | Ht 62.0 in | Wt 252.0 lb

## 2016-05-02 DIAGNOSIS — N200 Calculus of kidney: Secondary | ICD-10-CM

## 2016-05-02 DIAGNOSIS — R109 Unspecified abdominal pain: Secondary | ICD-10-CM

## 2016-05-02 DIAGNOSIS — N3001 Acute cystitis with hematuria: Secondary | ICD-10-CM | POA: Diagnosis not present

## 2016-05-02 LAB — URINALYSIS, COMPLETE
BILIRUBIN UA: NEGATIVE
Glucose, UA: NEGATIVE
KETONES UA: NEGATIVE
NITRITE UA: NEGATIVE
PH UA: 5 (ref 5.0–7.5)
Protein, UA: NEGATIVE
Urobilinogen, Ur: 0.2 mg/dL (ref 0.2–1.0)

## 2016-05-02 LAB — MICROSCOPIC EXAMINATION
Mucus, UA: NONE SEEN
Renal Epithel, UA: NONE SEEN /hpf

## 2016-05-02 MED ORDER — KETOROLAC TROMETHAMINE 60 MG/2ML IM SOLN
60.0000 mg | Freq: Once | INTRAMUSCULAR | Status: AC
  Administered 2016-05-02: 60 mg via INTRAMUSCULAR

## 2016-05-02 MED ORDER — OXYCODONE-ACETAMINOPHEN 10-325 MG PO TABS: 1.0000 | ORAL_TABLET | Freq: Four times a day (QID) | ORAL | 0 refills | Status: DC | PRN

## 2016-05-02 MED ORDER — TAMSULOSIN HCL 0.4 MG PO CAPS: 0.4000 mg | ORAL_CAPSULE | Freq: Every day | ORAL | 3 refills | Status: DC

## 2016-05-02 MED ORDER — CIPROFLOXACIN HCL 500 MG PO TABS: 500.0000 mg | ORAL_TABLET | Freq: Two times a day (BID) | ORAL | 0 refills | Status: DC

## 2016-05-02 NOTE — Patient Instructions (Signed)
Dietary Guidelines to Help Prevent Kidney Stones Kidney stones are deposits of minerals and salts that form inside your kidneys. Your risk of developing kidney stones may be greater depending on your diet, your lifestyle, the medicines you take, and whether you have certain medical conditions. Most people can reduce their chances of developing kidney stones by following the instructions below. Depending on your overall health and the type of kidney stones you tend to develop, your dietitian may give you more specific instructions. What are tips for following this plan? Reading food labels   Choose foods with "no salt added" or "low-salt" labels. Limit your sodium intake to less than 1500 mg per day.  Choose foods with calcium for each meal and snack. Try to eat about 300 mg of calcium at each meal. Foods that contain 200-500 mg of calcium per serving include:  8 oz (237 ml) of milk, fortified nondairy milk, and fortified fruit juice.  8 oz (237 ml) of kefir, yogurt, and soy yogurt.  4 oz (118 ml) of tofu.  1 oz of cheese.  1 cup (300 g) of dried figs.  1 cup (91 g) of cooked broccoli.  1-3 oz can of sardines or mackerel.  Most people need 1000 to 1500 mg of calcium each day. Talk to your dietitian about how much calcium is recommended for you. Shopping   Buy plenty of fresh fruits and vegetables. Most people do not need to avoid fruits and vegetables, even if they contain nutrients that may contribute to kidney stones.  When shopping for convenience foods, choose:  Whole pieces of fruit.  Premade salads with dressing on the side.  Low-fat fruit and yogurt smoothies.  Avoid buying frozen meals or prepared deli foods.  Look for foods with live cultures, such as yogurt and kefir. Cooking   Do not add salt to food when cooking. Place a salt shaker on the table and allow each person to add his or her own salt to taste.  Use vegetable protein, such as beans, textured vegetable  protein (TVP), or tofu instead of meat in pasta, casseroles, and soups. Meal planning   Eat less salt, if told by your dietitian. To do this:  Avoid eating processed or premade food.  Avoid eating fast food.  Eat less animal protein, including cheese, meat, poultry, or fish, if told by your dietitian. To do this:  Limit the number of times you have meat, poultry, fish, or cheese each week. Eat a diet free of meat at least 2 days a week.  Eat only one serving each day of meat, poultry, fish, or seafood.  When you prepare animal protein, cut pieces into small portion sizes. For most meat and fish, one serving is about the size of one deck of cards.  Eat at least 5 servings of fresh fruits and vegetables each day. To do this:  Keep fruits and vegetables on hand for snacks.  Eat 1 piece of fruit or a handful of berries with breakfast.  Have a salad and fruit at lunch.  Have two kinds of vegetables at dinner.  Limit foods that are high in a substance called oxalate. These include:  Spinach.  Rhubarb.  Beets.  Potato chips and french fries.  Nuts.  If you regularly take a diuretic medicine, make sure to eat at least 1-2 fruits or vegetables high in potassium each day. These include:  Avocado.  Banana.  Orange, prune, carrot, or tomato juice.  Baked potato.  Cabbage.    Beans and split peas. General instructions   Drink enough fluid to keep your urine clear or pale yellow. This is the most important thing you can do.  Talk to your health care provider and dietitian about taking daily supplements. Depending on your health and the cause of your kidney stones, you may be advised:  Not to take supplements with vitamin C.  To take a calcium supplement.  To take a daily probiotic supplement.  To take other supplements such as magnesium, fish oil, or vitamin B6.  Take all medicines and supplements as told by your health care provider.  Limit alcohol intake to no  more than 1 drink a day for nonpregnant women and 2 drinks a day for men. One drink equals 12 oz of beer, 5 oz of wine, or 1 oz of hard liquor.  Lose weight if told by your health care provider. Work with your dietitian to find strategies and an eating plan that works best for you. What foods are not recommended? Limit your intake of the following foods, or as told by your dietitian. Talk to your dietitian about specific foods you should avoid based on the type of kidney stones and your overall health. Grains  Breads. Bagels. Rolls. Baked goods. Salted crackers. Cereal. Pasta. Vegetables  Spinach. Rhubarb. Beets. Canned vegetables. Pickles. Olives. Meats and other protein foods  Nuts. Nut butters. Large portions of meat, poultry, or fish. Salted or cured meats. Deli meats. Hot dogs. Sausages. Dairy  Cheese. Beverages  Regular soft drinks. Regular vegetable juice. Seasonings and other foods  Seasoning blends with salt. Salad dressings. Canned soups. Soy sauce. Ketchup. Barbecue sauce. Canned pasta sauce. Casseroles. Pizza. Lasagna. Frozen meals. Potato chips. French fries. Summary  You can reduce your risk of kidney stones by making changes to your diet.  The most important thing you can do is drink enough fluid. You should drink enough fluid to keep your urine clear or pale yellow.  Ask your health care provider or dietitian how much protein from animal sources you should eat each day, and also how much salt and calcium you should have each day. This information is not intended to replace advice given to you by your health care provider. Make sure you discuss any questions you have with your health care provider. Document Released: 04/28/2010 Document Revised: 12/13/2015 Document Reviewed: 12/13/2015 Elsevier Interactive Patient Education  2017 Elsevier Inc.  

## 2016-05-02 NOTE — Progress Notes (Signed)
BP 94/60 (BP Location: Right Arm)   Pulse 79   Temp 97.4 F (36.3 C) (Oral)   Ht 5\' 2"  (1.575 m)   Wt 252 lb (114.3 kg)   LMP 04/18/2016   BMI 46.09 kg/m    Subjective:    Patient ID: Ruth Rivers, female    DOB: 1975-07-25, 41 y.o.   MRN: [id number]  HPI: Ruth Rivers is a 41 y.o. female presenting on 05/02/2016 for ? kidney stone (low back pain and radiates foward to abd.)  This patient has had kidney stones in the past. At this and she has had pain from the left flank and now is lower in her back. It radiates forward toward the bladder. She of course has frequency. She denies any fever or chills.  Relevant past medical, surgical, family and social history reviewed and updated as indicated. Allergies and medications reviewed and updated.  Past Medical History:  Diagnosis Date  . Adenomatous colon polyp   . Anxiety   . Cellulitis   . Depression   . Gastritis   . Insomnia   . Obesity     Past Surgical History:  Procedure Laterality Date  . BREAST REDUCTION SURGERY  2001  . CESAREAN SECTION    . CHOLECYSTECTOMY  10/1998  . COLONOSCOPY  2013  . COLONOSCOPY W/ BIOPSIES    . POLYPECTOMY  2013  . TONSILLECTOMY      Review of Systems  Allergies as of 05/02/2016      Reactions   Cephalosporins Itching, Rash   Penicillins Rash      Medication List       Accurate as of 05/02/16 11:59 PM. Always use your most recent med list.          BRINTELLIX 10 MG Tabs Generic drug:  vortioxetine HBr Take 5 mg by mouth 2 (two) times daily.   buPROPion 300 MG 24 hr tablet Commonly known as:  WELLBUTRIN XL   calcium-vitamin D 500-200 MG-UNIT tablet Commonly known as:  OSCAL WITH D Take 1 tablet by mouth daily.   ciprofloxacin 500 MG tablet Commonly known as:  CIPRO Take 1 tablet (500 mg total) by mouth 2 (two) times daily.   clonazePAM 1 MG tablet Commonly known as:  KLONOPIN Take 0.5 mg by mouth as needed.   oxyCODONE-acetaminophen 10-325 MG tablet Commonly known  as:  PERCOCET Take 1 tablet by mouth every 6 (six) hours as needed for pain.   tamsulosin 0.4 MG Caps capsule Commonly known as:  FLOMAX Take 1 capsule (0.4 mg total) by mouth daily.   traZODone 50 MG tablet Commonly known as:  DESYREL Take 50 mg by mouth at bedtime. 2 -3 tablets at bedtime          Objective:    BP 94/60 (BP Location: Right Arm)   Pulse 79   Temp 97.4 F (36.3 C) (Oral)   Ht 5\' 2"  (1.575 m)   Wt 252 lb (114.3 kg)   LMP 04/18/2016   BMI 46.09 kg/m   Allergies  Allergen Reactions  . Cephalosporins Itching and Rash  . Penicillins Rash    Physical Exam  Constitutional: She is oriented to person, place, and time. She appears well-developed and well-nourished.  HENT:  Head: Normocephalic and atraumatic.  Eyes: Conjunctivae are normal. Pupils are equal, round, and reactive to light.  Cardiovascular: Normal rate, regular rhythm, normal heart sounds and intact distal pulses.   Pulmonary/Chest: Effort normal and breath sounds normal.  Abdominal: Soft. Bowel  sounds are normal. She exhibits no distension and no mass. There is no hepatosplenomegaly. There is tenderness in the suprapubic area. There is CVA tenderness. There is no rigidity, no rebound and no guarding.  Neurological: She is alert and oriented to person, place, and time. She has normal reflexes.  Skin: Skin is warm and dry. No rash noted.  Psychiatric: She has a normal mood and affect. Her behavior is normal. Judgment and thought content normal.    Results for orders placed or performed in visit on 05/02/16  Microscopic Examination  Result Value Ref Range   WBC, UA 6-10 (A) 0 - 5 /hpf   RBC, UA 3-10 (A) 0 - 2 /hpf   Epithelial Cells (non renal) >10 (A) 0 - 10 /hpf   Renal Epithel, UA None seen None seen /hpf   Mucus, UA None seen Not Estab.   Bacteria, UA Moderate (A) None seen/Few  Urinalysis, Complete  Result Value Ref Range   Specific Gravity, UA <1.005 (L) 1.005 - 1.030   pH, UA 5.0 5.0 -  7.5   Color, UA Yellow Yellow   Appearance Ur Clear Clear   Leukocytes, UA 1+ (A) Negative   Protein, UA Negative Negative/Trace   Glucose, UA Negative Negative   Ketones, UA Negative Negative   RBC, UA 1+ (A) Negative   Bilirubin, UA Negative Negative   Urobilinogen, Ur 0.2 0.2 - 1.0 mg/dL   Nitrite, UA Negative Negative   Microscopic Examination See below:       Assessment & Plan:   1. Flank pain - Urinalysis, Complete - ketorolac (TORADOL) injection 60 mg; Inject 2 mLs (60 mg total) into the muscle once.  2. Acute cystitis with hematuria - ciprofloxacin (CIPRO) 500 MG tablet; Take 1 tablet (500 mg total) by mouth 2 (two) times daily.  Dispense: 20 tablet; Refill: 0 - ketorolac (TORADOL) injection 60 mg; Inject 2 mLs (60 mg total) into the muscle once. - Microscopic Examination  3. Kidney stone - oxyCODONE-acetaminophen (PERCOCET) 10-325 MG tablet; Take 1 tablet by mouth every 6 (six) hours as needed for pain.  Dispense: 30 tablet; Refill: 0 - tamsulosin (FLOMAX) 0.4 MG CAPS capsule; Take 1 capsule (0.4 mg total) by mouth daily.  Dispense: 30 capsule; Refill: 3 - ketorolac (TORADOL) injection 60 mg; Inject 2 mLs (60 mg total) into the muscle once.   Current Outpatient Prescriptions:  .  buPROPion (WELLBUTRIN XL) 300 MG 24 hr tablet, , Disp: , Rfl:  .  calcium-vitamin D (OSCAL WITH D) 500-200 MG-UNIT tablet, Take 1 tablet by mouth daily., Disp: , Rfl:  .  clonazePAM (KLONOPIN) 1 MG tablet, Take 0.5 mg by mouth as needed. , Disp: , Rfl:  .  traZODone (DESYREL) 50 MG tablet, Take 50 mg by mouth at bedtime. 2 -3 tablets at bedtime, Disp: , Rfl:  .  Vortioxetine HBr (BRINTELLIX) 10 MG TABS, Take 5 mg by mouth 2 (two) times daily. , Disp: , Rfl:  .  ciprofloxacin (CIPRO) 500 MG tablet, Take 1 tablet (500 mg total) by mouth 2 (two) times daily., Disp: 20 tablet, Rfl: 0 .  oxyCODONE-acetaminophen (PERCOCET) 10-325 MG tablet, Take 1 tablet by mouth every 6 (six) hours as needed for  pain., Disp: 30 tablet, Rfl: 0 .  tamsulosin (FLOMAX) 0.4 MG CAPS capsule, Take 1 capsule (0.4 mg total) by mouth daily., Disp: 30 capsule, Rfl: 3  Continue all other maintenance medications as listed above.  Follow up plan: Follow-up as needed or worsening of  symptoms. Call office for any issues.  Educational handout given for kidney stones  Terald Sleeper PA-C [facility] [address]  [place], [place] [address] [phone]   05/03/2016, 1:38 PM

## 2016-05-03 DIAGNOSIS — N200 Calculus of kidney: Secondary | ICD-10-CM | POA: Insufficient documentation

## 2016-05-03 DIAGNOSIS — N3001 Acute cystitis with hematuria: Secondary | ICD-10-CM | POA: Insufficient documentation

## 2016-05-04 ENCOUNTER — Telehealth: Payer: Self-pay | Admitting: Family

## 2016-05-04 MED ORDER — PROMETHAZINE HCL 25 MG PO TABS: 25.0000 mg | ORAL_TABLET | Freq: Three times a day (TID) | ORAL | 0 refills | Status: DC | PRN

## 2016-05-04 NOTE — Telephone Encounter (Signed)
Covering for PCP  Phenergan sent.   Laroy Apple, MD [facility] 05/04/2016, 9:06 AM

## 2016-05-04 NOTE — Telephone Encounter (Signed)
Seen 05-02-16 by Ms Ronnald Ramp and then had to go to urgent care later with pain from kidney stones. She was given zofran for nausea but it is not working.  Please send in another nausea medication to [facility].

## 2016-05-04 NOTE — Telephone Encounter (Signed)
Aware of medication per message left on voice mail.

## 2016-07-12 ENCOUNTER — Ambulatory Visit (INDEPENDENT_AMBULATORY_CARE_PROVIDER_SITE_OTHER): Payer: BC Managed Care – PPO | Admitting: Family Medicine

## 2016-07-12 ENCOUNTER — Encounter: Payer: Self-pay | Admitting: Family Medicine

## 2016-07-12 VITALS — BP 92/60 | HR 89 | Temp 97.5°F | Ht 62.0 in | Wt 246.0 lb

## 2016-07-12 DIAGNOSIS — G4700 Insomnia, unspecified: Secondary | ICD-10-CM | POA: Diagnosis not present

## 2016-07-12 MED ORDER — ZOLPIDEM TARTRATE ER 6.25 MG PO TBCR: 6.2500 mg | EXTENDED_RELEASE_TABLET | Freq: Every evening | ORAL | 0 refills | Status: DC | PRN

## 2016-07-12 NOTE — Progress Notes (Signed)
   BP 92/60   Pulse 89   Temp 97.5 F (36.4 C) (Oral)   Ht 5\' 2"  (1.575 m)   Wt 246 lb (111.6 kg)   BMI 44.99 kg/m    Subjective:    Patient ID: Ruth Rivers, female    DOB: 1975-06-22, 41 y.o.   MRN: [id number]  HPI: Ruth Rivers is a 41 y.o. female presenting on 07/12/2016 for Insomnia (difficulty falling asleep and staying asleep; is practicing sleep hygiene and taking Melatonin with magnesium, but it does not seem to help)   HPI Difficulty sleeping Patient has been having difficulty falling asleep and staying asleep. She's been trying to practice sleep hygiene and using melatonin and using her trazodone but it does not helping anymore. She is about 1 and a trip and she feels like she really needs to be able to sleep well to go on this trip. She denies any suicidal ideations. She has known depression and has a psychiatrist that she sees regularly for this.  Relevant past medical, surgical, family and social history reviewed and updated as indicated. Interim medical history since our last visit reviewed. Allergies and medications reviewed and updated.  Review of Systems  Constitutional: Negative for chills and fever.  Respiratory: Negative for chest tightness and shortness of breath.   Cardiovascular: Negative for chest pain and leg swelling.  Musculoskeletal: Negative for back pain and gait problem.  Skin: Negative for rash.  Neurological: Negative for light-headedness and headaches.  Psychiatric/Behavioral: Positive for sleep disturbance. Negative for agitation and behavioral problems.  All other systems reviewed and are negative.   Per HPI unless specifically indicated above     Objective:    BP 92/60   Pulse 89   Temp 97.5 F (36.4 C) (Oral)   Ht 5\' 2"  (1.575 m)   Wt 246 lb (111.6 kg)   BMI 44.99 kg/m   Wt Readings from Last 3 Encounters:  07/12/16 246 lb (111.6 kg)  05/02/16 252 lb (114.3 kg)  03/01/16 243 lb 6.4 oz (110.4 kg)    Physical Exam    Constitutional: She is oriented to person, place, and time. She appears well-developed and well-nourished. No distress.  Eyes: Conjunctivae are normal.  Cardiovascular: Normal rate, regular rhythm, normal heart sounds and intact distal pulses.   No murmur heard. Pulmonary/Chest: Effort normal and breath sounds normal. No respiratory distress. She has no wheezes. She has no rales.  Musculoskeletal: Normal range of motion.  Neurological: She is alert and oriented to person, place, and time. Coordination normal.  Skin: Skin is warm and dry. No rash noted. She is not diaphoretic.  Psychiatric: She has a normal mood and affect. Her behavior is normal.  Nursing note and vitals reviewed.     Assessment & Plan:   Problem List Items Addressed This Visit      Other   Insomnia - Primary   Relevant Medications   zolpidem (AMBIEN CR) 6.25 MG CR tablet       Follow up plan: Return if symptoms worsen or fail to improve.  Counseling provided for all of the vaccine components No orders of the defined types were placed in this encounter.   [name], MD [facility] 07/12/2016, 2:23 PM

## 2016-08-06 ENCOUNTER — Other Ambulatory Visit: Payer: Self-pay | Admitting: Family Medicine

## 2016-08-06 DIAGNOSIS — G4700 Insomnia, unspecified: Secondary | ICD-10-CM

## 2016-08-07 NOTE — Telephone Encounter (Signed)
rx called into pharmacy

## 2016-08-07 NOTE — Telephone Encounter (Signed)
Go ahead and call in a refill for Ambien

## 2016-08-26 ENCOUNTER — Other Ambulatory Visit: Payer: Self-pay | Admitting: Family Medicine

## 2016-08-26 DIAGNOSIS — G4700 Insomnia, unspecified: Secondary | ICD-10-CM

## 2016-08-27 NOTE — Telephone Encounter (Signed)
Rx called in to pharmacy. 

## 2016-08-27 NOTE — Telephone Encounter (Signed)
Go ahead and call in refill 

## 2016-09-15 ENCOUNTER — Other Ambulatory Visit: Payer: Self-pay | Admitting: Family Medicine

## 2016-09-15 DIAGNOSIS — G4700 Insomnia, unspecified: Secondary | ICD-10-CM

## 2016-09-18 NOTE — Telephone Encounter (Signed)
Go ahead and call in refill 

## 2016-09-19 NOTE — Telephone Encounter (Signed)
Phoned in.

## 2016-10-22 ENCOUNTER — Other Ambulatory Visit: Payer: Self-pay | Admitting: Family Medicine

## 2016-10-22 DIAGNOSIS — G4700 Insomnia, unspecified: Secondary | ICD-10-CM

## 2016-10-24 NOTE — Telephone Encounter (Signed)
Go ahead and call in refill for the patient Rebekkah Powless, MD [facility] 10/24/2016, 9:11 AM    

## 2016-10-24 NOTE — Telephone Encounter (Signed)
Phoned in.

## 2016-11-15 ENCOUNTER — Ambulatory Visit (INDEPENDENT_AMBULATORY_CARE_PROVIDER_SITE_OTHER): Payer: BC Managed Care – PPO | Admitting: Family Medicine

## 2016-11-15 ENCOUNTER — Encounter: Payer: Self-pay | Admitting: Family Medicine

## 2016-11-15 VITALS — BP 138/84 | HR 75 | Temp 97.9°F | Ht 62.0 in | Wt 247.0 lb

## 2016-11-15 DIAGNOSIS — G43009 Migraine without aura, not intractable, without status migrainosus: Secondary | ICD-10-CM | POA: Diagnosis not present

## 2016-11-15 MED ORDER — KETOROLAC TROMETHAMINE 60 MG/2ML IM SOLN
60.0000 mg | Freq: Once | INTRAMUSCULAR | Status: AC
  Administered 2016-11-15: 60 mg via INTRAMUSCULAR

## 2016-11-15 MED ORDER — PROMETHAZINE HCL 25 MG/ML IJ SOLN
25.0000 mg | Freq: Once | INTRAMUSCULAR | Status: AC
  Administered 2016-11-15: 25 mg via INTRAVENOUS

## 2016-11-15 MED ORDER — METHYLPREDNISOLONE ACETATE 80 MG/ML IJ SUSP
80.0000 mg | Freq: Once | INTRAMUSCULAR | Status: AC
  Administered 2016-11-15: 80 mg via INTRAMUSCULAR

## 2016-11-15 NOTE — Progress Notes (Signed)
BP 138/84   Pulse 75   Temp 97.9 F (36.6 C) (Oral)   Ht 5\' 2"  (1.575 m)   Wt 247 lb (112 kg)   BMI 45.18 kg/m    Subjective:    Patient ID: Ruth Rivers, female    DOB: 25-Nov-1975, 41 y.o.   MRN: [id number]  HPI: Ruth Rivers is a 41 y.o. female presenting on 11/15/2016 for Migraine (x 3 days; has taken Tylenol, Ibuprofen)   HPI Migraine for 3 days Patient is coming in for a migraine headache that has been going on for 3 days and just does not seem to want to go away.  She has had migraines throughout her life but she does not get them very consistently and maybe gets 1 a year.  They usually are short-lived and she is able to combat them with Tylenol and ibuprofen.  This time it does not seem to want to go away.  She is sensitive to light and sounds.  She denies any aura.  She denies any numbness or weakness.  The headache is located in the frontal region but occasionally shoots back into the occipital region.  She says this is just like her usual migraines that she had previously and not any more severe or different except for the fact that it is lasting longer.  Patient is in the room with the lights out for exam  Relevant past medical, surgical, family and social history reviewed and updated as indicated. Interim medical history since our last visit reviewed. Allergies and medications reviewed and updated.  Review of Systems  Constitutional: Negative for chills and fever.  HENT: Negative for congestion, ear discharge and ear pain.   Eyes: Positive for photophobia. Negative for redness and visual disturbance.  Respiratory: Negative for cough, chest tightness and shortness of breath.   Cardiovascular: Negative for chest pain and leg swelling.  Gastrointestinal: Positive for nausea. Negative for vomiting.  Genitourinary: Negative for difficulty urinating and dysuria.  Musculoskeletal: Negative for back pain and gait problem.  Skin: Negative for rash.  Neurological: Positive for  headaches. Negative for dizziness, light-headedness and numbness.  Psychiatric/Behavioral: Negative for agitation and behavioral problems.  All other systems reviewed and are negative.   Per HPI unless specifically indicated above        Objective:    BP 138/84   Pulse 75   Temp 97.9 F (36.6 C) (Oral)   Ht 5\' 2"  (1.575 m)   Wt 247 lb (112 kg)   BMI 45.18 kg/m   Wt Readings from Last 3 Encounters:  11/15/16 247 lb (112 kg)  07/12/16 246 lb (111.6 kg)  05/02/16 252 lb (114.3 kg)    Physical Exam  Constitutional: She is oriented to person, place, and time. She appears well-developed and well-nourished. No distress.  Eyes: Pupils are equal, round, and reactive to light. Conjunctivae are normal.  Cardiovascular: Normal rate, regular rhythm, normal heart sounds and intact distal pulses.   No murmur heard. Pulmonary/Chest: Effort normal and breath sounds normal. No respiratory distress. She has no wheezes. She has no rales.  Musculoskeletal: Normal range of motion. She exhibits no edema.  Neurological: She is alert and oriented to person, place, and time. No cranial nerve deficit. Coordination normal.  Skin: Skin is warm and dry. No rash noted. She is not diaphoretic.  Psychiatric: She has a normal mood and affect. Her behavior is normal.  Nursing note and vitals reviewed.     Assessment & Plan:   Problem  List Items Addressed This Visit    None    Visit Diagnoses    Migraine without aura and without status migrainosus, not intractable    -  Primary   Been 3 days for this current episode, has been a while since she has had one previously.   Relevant Medications   methylPREDNISolone acetate (DEPO-MEDROL) injection 80 mg (Start on 11/15/2016  8:30 AM)   ketorolac (TORADOL) injection 60 mg (Start on 11/15/2016  8:30 AM)   promethazine (PHENERGAN) injection 25 mg (Start on 11/15/2016  8:30 AM)       Follow up plan: Return if symptoms worsen or fail to improve.  Counseling  provided for all of the vaccine components No orders of the defined types were placed in this encounter.   [name], MD [facility] 11/15/2016, 8:24 AM

## 2016-11-16 ENCOUNTER — Other Ambulatory Visit: Payer: Self-pay | Admitting: Family Medicine

## 2016-11-16 DIAGNOSIS — G4700 Insomnia, unspecified: Secondary | ICD-10-CM

## 2016-11-16 NOTE — Telephone Encounter (Signed)
Called in.

## 2016-11-16 NOTE — Telephone Encounter (Signed)
Seen yesterday     If approved route to nurse to call into [facility]

## 2016-11-16 NOTE — Telephone Encounter (Signed)
Go ahead and call in refill 

## 2016-11-30 ENCOUNTER — Other Ambulatory Visit: Payer: Self-pay | Admitting: Family Medicine

## 2016-11-30 DIAGNOSIS — G4700 Insomnia, unspecified: Secondary | ICD-10-CM

## 2016-12-03 NOTE — Telephone Encounter (Signed)
Last seen 11/15/16  Dr D  If approved route to nurse to call into [facility]

## 2016-12-03 NOTE — Telephone Encounter (Signed)
Phoned in.

## 2016-12-03 NOTE — Telephone Encounter (Signed)
Go ahead and call in refill.  Give her enough to get through the next appointment, I think that is 3 months from now

## 2016-12-10 ENCOUNTER — Encounter: Payer: Self-pay | Admitting: Family Medicine

## 2016-12-10 ENCOUNTER — Ambulatory Visit: Payer: BC Managed Care – PPO | Admitting: Family Medicine

## 2016-12-10 ENCOUNTER — Ambulatory Visit (INDEPENDENT_AMBULATORY_CARE_PROVIDER_SITE_OTHER): Payer: BC Managed Care – PPO

## 2016-12-10 VITALS — BP 110/77 | HR 77 | Temp 97.5°F | Ht 62.0 in | Wt 246.0 lb

## 2016-12-10 DIAGNOSIS — M545 Low back pain: Secondary | ICD-10-CM

## 2016-12-10 LAB — MICROSCOPIC EXAMINATION: Renal Epithel, UA: NONE SEEN /hpf

## 2016-12-10 LAB — URINALYSIS, COMPLETE
Bilirubin, UA: NEGATIVE
Glucose, UA: NEGATIVE
LEUKOCYTES UA: NEGATIVE
Nitrite, UA: NEGATIVE
PH UA: 5.5 (ref 5.0–7.5)
Protein, UA: NEGATIVE
Specific Gravity, UA: 1.02 (ref 1.005–1.030)
UUROB: 0.2 mg/dL (ref 0.2–1.0)

## 2016-12-10 MED ORDER — OXYCODONE-ACETAMINOPHEN 5-325 MG PO TABS: 1.0000 | ORAL_TABLET | Freq: Four times a day (QID) | ORAL | 0 refills | Status: DC | PRN

## 2016-12-10 MED ORDER — KETOROLAC TROMETHAMINE 60 MG/2ML IM SOLN
60.0000 mg | Freq: Once | INTRAMUSCULAR | Status: AC
  Administered 2016-12-10: 60 mg via INTRAMUSCULAR

## 2016-12-10 MED ORDER — TAMSULOSIN HCL 0.4 MG PO CAPS: 0.4000 mg | ORAL_CAPSULE | Freq: Every day | ORAL | 0 refills | Status: DC

## 2016-12-10 NOTE — Patient Instructions (Signed)
Great to see you!   Kidney Stones Kidney stones (urolithiasis) are rock-like masses that form inside of the kidneys. Kidneys are organs that make pee (urine). A kidney stone can cause very bad pain and can block the flow of pee. The stone usually leaves your body (passes) through your pee. You may need to have a doctor take out the stone. Follow these instructions at home: Eating and drinking  Drink enough fluid to keep your pee clear or pale yellow. This will help you pass the stone.  If told by your doctor, change the foods you eat (your diet). This may include: ? Limiting how much salt (sodium) you eat. ? Eating more fruits and vegetables. ? Limiting how much meat, poultry, fish, and eggs you eat.  Follow instructions from your doctor about eating or drinking restrictions. General instructions  Collect pee samples as told by your doctor. You may need to collect a pee sample: ? 24 hours after a stone comes out. ? 8-12 weeks after a stone comes out, and every 6-12 months after that.  Strain your pee every time you pee (urinate), for as long as told. Use the strainer that your doctor recommends.  Do not throw out the stone. Keep it so that it can be tested by your doctor.  Take over-the-counter and prescription medicines only as told by your doctor.  Keep all follow-up visits as told by your doctor. This is important. You may need follow-up tests. Preventing kidney stones To prevent another kidney stone:  Drink enough fluid to keep your pee clear or pale yellow. This is the best way to prevent kidney stones.  Eat healthy foods.  Avoid certain foods as told by your doctor. You may be told to eat less protein.  Stay at a healthy weight.  Contact a doctor if:  You have pain that gets worse or does not get better with medicine. Get help right away if:  You have a fever or chills.  You get very bad pain.  You get new pain in your belly (abdomen).  You pass out  (faint).  You cannot pee. This information is not intended to replace advice given to you by your health care provider. Make sure you discuss any questions you have with your health care provider. Document Released: 06/20/2007 Document Revised: 09/20/2015 Document Reviewed: 09/20/2015 Elsevier Interactive Patient Education  2017 [employer].

## 2016-12-10 NOTE — Progress Notes (Signed)
   HPI  Patient presents today here with back pain and concern for kidney stone.  Patient explains she has had right-sided low back pain that is dull and achy for 3 days with intermittent sharp stabbing pains.  Patient states that this is very consistent with previous kidney stones which she has been able to pass without surgery.  She denies fever, chills, sweats.  She has improvement with Tylenol and ibuprofen.  She is tolerating food and fluids like usual she has no leg symptoms.  She has no leg weakness.  PMH: Smoking status noted ROS: Per HPI  Objective: BP 110/77 (BP Location: Right Arm)   Pulse 77   Temp (!) 97.5 F (36.4 C) (Oral)   Ht 5\' 2"  (1.575 m)   Wt 246 lb (111.6 kg)   BMI 44.99 kg/m  Gen: NAD, alert, cooperative with exam HEENT: NCAT CV: RRR, good S1/S2, no murmur Resp: CTABL, no wheezes, non-labored Ext: No edema, warm Neuro: Alert and oriented, No gross deficits MSK Mild tenderness to palpation her right low back paraspinal muscles and SI joint  Urinalysis positive for blood, no leukocytes or nitrites  Assessment and plan:  #Right-sided low back pain Likely renal colic Plain film, hopefully we will see a stone that can be measured. Flomax, small amount of Percocet given Also given IM Toradol  She will call back if symptoms are not improving as her usual renal stones do, I will pursue a CT scan at that time.  I offered one today and due to cost she would like to wait     Orders Placed This Encounter  Procedures  . Urine Culture  . DG Abd 1 View    Standing Status:   Future    Standing Expiration Date:   12/10/2017    Order Specific Question:   Reason for Exam (SYMPTOM  OR DIAGNOSIS REQUIRED)    Answer:   Eval for stone    Order Specific Question:   Is the patient pregnant?    Answer:   No    Order Specific Question:   Preferred imaging location?    Answer:   Internal  . Urinalysis, Complete    Meds ordered this encounter  Medications    . oxyCODONE-acetaminophen (ROXICET) 5-325 MG tablet    Sig: Take 1 tablet by mouth every 6 (six) hours as needed for severe pain.    Dispense:  20 tablet    Refill:  0  . tamsulosin (FLOMAX) 0.4 MG CAPS capsule    Sig: Take 1 capsule (0.4 mg total) by mouth daily.    Dispense:  10 capsule    Refill:  [place], MD [facility] 12/10/2016, 8:47 AM

## 2016-12-12 ENCOUNTER — Telehealth: Payer: Self-pay | Admitting: Family Medicine

## 2016-12-12 LAB — URINE CULTURE

## 2016-12-12 NOTE — Telephone Encounter (Signed)
Pt aware of xray results

## 2017-02-15 ENCOUNTER — Encounter: Payer: Self-pay | Admitting: Family Medicine

## 2017-02-15 ENCOUNTER — Ambulatory Visit: Payer: BC Managed Care – PPO | Admitting: Family Medicine

## 2017-02-15 VITALS — BP 130/88 | HR 85 | Temp 99.7°F | Ht 62.0 in | Wt 245.0 lb

## 2017-02-15 DIAGNOSIS — A084 Viral intestinal infection, unspecified: Secondary | ICD-10-CM

## 2017-02-15 NOTE — Progress Notes (Signed)
BP 130/88   Pulse 85   Temp 99.7 F (37.6 C) (Oral)   Ht 5\' 2"  (1.575 m)   Wt 245 lb (111.1 kg)   BMI 44.81 kg/m    Subjective:    Patient ID: Ruth Rivers, female    DOB: 1976-01-15, 42 y.o.   MRN: [id number]  HPI: Ruth Rivers is a 42 y.o. female presenting on 02/15/2017 for Nausea and diarrhea (x 3 days)   HPI Diarrhea and nausea times 2-1/2 days Patient is coming in with complaints of diarrhea and nausea and vomiting that is been going on for the past 2-1/2 days.  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  She is mainly coming in today because she is missed the last 2 days of work and she needs a note for work today.  She has had about 6-7 episodes of diarrhea today but has not had an episode of vomiting since 0430 this morning.  She denies any blood in her vomit or stool.  She denies any fevers or chills and has still been having good urinary.  Relevant past medical, surgical, family and social history reviewed and updated as indicated. Interim medical history since our last visit reviewed. Allergies and medications reviewed and updated.  Review of Systems  Constitutional: Negative for chills and fever.  Eyes: Negative for visual disturbance.  Respiratory: Negative for chest tightness and shortness of breath.   Cardiovascular: Negative for chest pain and leg swelling.  Gastrointestinal: Positive for abdominal pain, nausea and vomiting.  Musculoskeletal: Negative for back pain and gait problem.  Skin: Negative for rash.  Neurological: Negative for light-headedness and headaches.  Psychiatric/Behavioral: Negative for agitation and behavioral problems.  All other systems reviewed and are negative.   Per HPI unless specifically indicated above     Objective:    BP 130/88   Pulse 85   Temp 99.7 F (37.6 C) (Oral)   Ht 5\' 2"  (1.575 m)   Wt 245 lb (111.1 kg)   BMI 44.81 kg/m   Wt Readings from Last 3 Encounters:  02/15/17 245 lb  (111.1 kg)  12/10/16 246 lb (111.6 kg)  11/15/16 247 lb (112 kg)    Physical Exam  Constitutional: She is oriented to person, place, and time. She appears well-developed and well-nourished. No distress.  Eyes: Conjunctivae are normal.  Cardiovascular: Normal rate, regular rhythm, normal heart sounds and intact distal pulses.  No murmur heard. Pulmonary/Chest: Effort normal and breath sounds normal. No respiratory distress. She has no wheezes.  Abdominal: Soft. Bowel sounds are normal. She exhibits no distension. There is no tenderness. There is no rebound.  Musculoskeletal: Normal range of motion. She exhibits no edema or tenderness.  Neurological: She is alert and oriented to person, place, and time. Coordination normal.  Skin: Skin is warm and dry. No rash noted. She is not diaphoretic.  Psychiatric: She has a normal mood and affect. Her behavior is normal.  Nursing note and vitals reviewed.     Assessment & Plan:   Problem List Items Addressed This Visit    None    Visit Diagnoses    Viral gastroenteritis    -  Primary   Has ondansetron and recommended for her to go ahead and try it.       Follow up plan: Return if symptoms worsen or fail to improve.  Counseling provided for all of the vaccine components No orders of the defined types were placed  in this encounter.   [name], MD [facility] 02/15/2017, 3:20 PM

## 2017-03-25 ENCOUNTER — Other Ambulatory Visit: Payer: Self-pay | Admitting: Family Medicine

## 2017-03-25 DIAGNOSIS — G4700 Insomnia, unspecified: Secondary | ICD-10-CM

## 2017-06-19 ENCOUNTER — Telehealth: Payer: Self-pay | Admitting: Family Medicine

## 2017-06-19 ENCOUNTER — Encounter: Payer: Self-pay | Admitting: Family Medicine

## 2017-06-19 ENCOUNTER — Ambulatory Visit: Payer: BC Managed Care – PPO | Admitting: Family Medicine

## 2017-06-19 VITALS — BP 113/74 | HR 75 | Temp 97.2°F | Ht 62.0 in | Wt 244.0 lb

## 2017-06-19 DIAGNOSIS — M545 Low back pain, unspecified: Secondary | ICD-10-CM

## 2017-06-19 MED ORDER — CYCLOBENZAPRINE HCL 10 MG PO TABS: 10.0000 mg | ORAL_TABLET | Freq: Three times a day (TID) | ORAL | 1 refills | Status: DC | PRN

## 2017-06-19 NOTE — Progress Notes (Signed)
BP 113/74   Pulse 75   Temp (!) 97.2 F (36.2 C) (Oral)   Ht 5\' 2"  (1.575 m)   Wt 244 lb (110.7 kg)   BMI 44.63 kg/m    Subjective:    Patient ID: Ruth Rivers, female    DOB: Apr 06, 1975, 42 y.o.   MRN: [id number]  HPI: Ruth Rivers is a 42 y.o. female presenting on 06/19/2017 for Follow-up (06/17/17- back strain )   HPI Back pain Low back pain that has been going on for the past 2 days.  She says that she was at the restaurant and she reached over to grab her purse and felt something tight in her back and ever since then has been having a lot of pain.  That same afternoon on 06/17/2017 she went into the urgent care and they diagnosed her with lumbar strain and gave her prednisone Dosepak and Flexeril.  She says they have been helping but she is currently out of the Flexeril.  She is coming in today because it is just not fully improving, she was initially better yesterday but today is back worse but not as bad as it was initially.  She denies any shooting pains going down her legs or numbness or weakness but mainly describes low back pain in a bandlike area.  Patient is morbidly obese and trying to lose weight but not successful, she went and saw one dietitian but they basically letter go with some basic advice.  She would like to see somebody else if possible.  Relevant past medical, surgical, family and social history reviewed and updated as indicated. Interim medical history since our last visit reviewed. Allergies and medications reviewed and updated.  Review of Systems  Constitutional: Negative for chills and fever.  Eyes: Negative for visual disturbance.  Respiratory: Negative for chest tightness and shortness of breath.   Cardiovascular: Negative for chest pain and leg swelling.  Gastrointestinal: Negative for abdominal pain.  Musculoskeletal: Positive for back pain and myalgias. Negative for gait problem.  Skin: Negative for rash.  Neurological: Negative for weakness,  light-headedness, numbness and headaches.  Psychiatric/Behavioral: Negative for agitation and behavioral problems.  All other systems reviewed and are negative.   Per HPI unless specifically indicated above   Allergies as of 06/19/2017      Reactions   Cephalosporins Itching, Rash   Penicillins Rash      Medication List        Accurate as of 06/19/17 11:58 AM. Always use your most recent med list.          BRINTELLIX 10 MG Tabs tablet Generic drug:  vortioxetine HBr Take 5 mg by mouth 2 (two) times daily.   buPROPion 300 MG 24 hr tablet Commonly known as:  WELLBUTRIN XL   calcium-vitamin D 500-200 MG-UNIT tablet Commonly known as:  OSCAL WITH D Take 1 tablet by mouth daily.   clonazePAM 1 MG tablet Commonly known as:  KLONOPIN Take 0.5 mg by mouth as needed.   cyclobenzaprine 10 MG tablet Commonly known as:  FLEXERIL Take 1 tablet (10 mg total) by mouth 3 (three) times daily as needed for muscle spasms.   predniSONE 5 MG tablet Commonly known as:  DELTASONE Take 6-5-4-3-2-1 po qd   traZODone 50 MG tablet Commonly known as:  DESYREL Take 50 mg by mouth at bedtime. 2 -3 tablets at bedtime   zolpidem 6.25 MG CR tablet Commonly known as:  AMBIEN CR TAKE 1 TO 2 TABLETS AT BEDTIME AS  NEEEDED FOR SLEEP          Objective:    BP 113/74   Pulse 75   Temp (!) 97.2 F (36.2 C) (Oral)   Ht 5\' 2"  (1.575 m)   Wt 244 lb (110.7 kg)   BMI 44.63 kg/m   Wt Readings from Last 3 Encounters:  06/19/17 244 lb (110.7 kg)  02/15/17 245 lb (111.1 kg)  12/10/16 246 lb (111.6 kg)    Physical Exam  Constitutional: She is oriented to person, place, and time. She appears well-developed and well-nourished. No distress.  Eyes: Conjunctivae are normal.  Musculoskeletal: Normal range of motion. She exhibits tenderness (Bilateral lumbar pain in a bandlike area across her lower back, negative straight leg raise bilaterally, no pain in midline). She exhibits no edema.  Neurological:  She is alert and oriented to person, place, and time. Coordination normal.  Skin: Skin is warm and dry. No rash noted. She is not diaphoretic.  Psychiatric: She has a normal mood and affect. Her behavior is normal.  Nursing note and vitals reviewed.       Assessment & Plan:   Problem List Items Addressed This Visit    None    Visit Diagnoses    Acute bilateral low back pain without sciatica    -  Primary   Relevant Medications   predniSONE (DELTASONE) 5 MG tablet   cyclobenzaprine (FLEXERIL) 10 MG tablet   Morbid obesity (HCC)       Relevant Orders   Amb ref to Medical Nutrition Therapy-MNT       Follow up plan: Return if symptoms worsen or fail to improve.  Counseling provided for all of the vaccine components No orders of the defined types were placed in this encounter.   [name], MD [facility] 06/19/2017, 11:58 AM

## 2017-06-19 NOTE — Patient Instructions (Signed)
Low Back Sprain  A sprain is a stretch or tear in the bands of tissue that hold bones and joints together (ligaments). Sprains of the lower back (lumbar spine) are a common cause of low back pain. A sprain occurs when ligaments are overextended or stretched beyond their limits. The ligaments can become inflamed, resulting in pain and sudden muscle tightening (spasms). A sprain can be caused by an injury (trauma), or it can develop gradually due to overuse.  There are three types of sprains:  · Grade 1 is a mild sprain involving an overstretched ligament or a very slight tear of the ligament.  · Grade 2 is a moderate sprain involving a partial tear of the ligament.  · Grade 3 is a severe sprain involving a complete tear of the ligament.    What are the causes?  This condition may be caused by:  · Trauma, such as a fall or a hit to the body.  · Twisting or overstretching the back. This may result from doing activities that require a lot of energy, such as lifting heavy objects.    What increases the risk?  The following factors may increase your risk of getting this condition:  · Playing contact sports.  · Participating in sports or activities that put excessive stress on the back and require a lot of bending and twisting, including:  ? Lifting weights or heavy objects.  ? Gymnastics.  ? Soccer.  ? Figure skating.  ? Snowboarding.  · Being overweight or obese.  · Having poor strength and flexibility.    What are the signs or symptoms?  Symptoms of this condition may include:  · Sharp or dull pain in the lower back that does not go away. Pain may extend to the buttocks.  · Stiffness.  · Limited range of motion.  · Inability to stand up straight due to stiffness or pain.  · Muscle spasms.    How is this diagnosed?    This condition may be diagnosed based on:  · Your symptoms.  · Your medical history.  · A physical exam.  ? Your health care provider may push on certain areas of your back to determine the source of your  pain.  ? You may be asked to bend forward, backward, and side to side to assess the severity of your pain and your range of motion.  · Imaging tests, such as:  ? X-rays.  ? MRI.    How is this treated?  Treatment for this condition may include:  · Applying heat and cold to the affected area.  · Medicines to help relieve pain and to relax your muscles (muscle relaxants).  · NSAIDs to help reduce swelling and discomfort.  · Physical therapy.    When your symptoms improve, it is important to gradually return to your normal routine as soon as possible to reduce pain, avoid stiffness, and avoid loss of muscle strength. Generally, symptoms should improve within 6 weeks of treatment. However, recovery time varies.  Follow these instructions at home:  Managing pain, stiffness, and swelling  · If directed, apply ice to the injured area during the first 24 hours after your injury.  ? Put ice in a plastic bag.  ? Place a towel between your skin and the bag.  ? Leave the ice on for 20 minutes, 2-3 times a day.  · If directed, apply heat to the affected area as often as told by your health care provider. Use the   heat source that your health care provider recommends, such as a moist heat pack or a heating pad.  ? Place a towel between your skin and the heat source.  ? Leave the heat on for 20-30 minutes.  ? Remove the heat if your skin turns bright red. This is especially important if you are unable to feel pain, heat, or cold. You may have a greater risk of getting burned.  Activity  · Rest and return to your normal activities as told by your health care provider. Ask your health care provider what activities are safe for you.  · Avoid activities that take a lot of effort (are strenuous) for as long as told by your health care provider.  · Do exercises as told by your health care provider.  General instructions    · Take over-the-counter and prescription medicines only as told by your health care provider.  · If you have  questions or concerns about safety while taking pain medicine, talk with your health care provider.  · Do not drive or operate heavy machinery until you know how your pain medicine affects you.  · Do not use any tobacco products, such as cigarettes, chewing tobacco, and e-cigarettes. Tobacco can delay bone healing. If you need help quitting, ask your health care provider.  · Keep all follow-up visits as told by your health care provider. This is important.  How is this prevented?  · Warm up and stretch before being active.  · Cool down and stretch after being active.  · Give your body time to rest between periods of activity.  · Avoid:  ? Being physically inactive for long periods at a time.  ? Exercising or playing sports when you are tired or in pain.  · Use correct form when playing sports and lifting heavy objects.  · Use good posture when sitting and standing.  · Maintain a healthy weight.  · Sleep on a mattress with medium firmness to support your back.  · Make sure to use equipment that fits you, including shoes that fit well.  · Be safe and responsible while being active to avoid falls.  · Do at least 150 minutes of moderate-intensity exercise each week, such as brisk walking or water aerobics. Try a form of exercise that takes stress off your back, such as swimming or stationary cycling.  · Maintain physical fitness, including:  ? Strength. In particular, develop and maintain strong abdominal muscles.  ? Flexibility.  ? Cardiovascular fitness.  ? Endurance.  Contact a health care provider if:  · Your back pain does not improve after 6 weeks of treatment.  · Your symptoms get worse.  Get help right away if:  · Your back pain is severe.  · You are unable to stand or walk.  · You develop pain in your legs.  · You develop weakness in your buttocks or legs.  · You have difficulty controlling when you urinate or when you have a bowel movement.  This information is not intended to replace advice given to you by  your health care provider. Make sure you discuss any questions you have with your health care provider.  Document Released: 01/01/2005 Document Revised: 09/08/2015 Document Reviewed: 10/13/2014  Elsevier Interactive Patient Education © 2018 [employer]

## 2017-06-19 NOTE — Telephone Encounter (Signed)
Pt made apt to be seen

## 2017-06-30 ENCOUNTER — Other Ambulatory Visit: Payer: Self-pay | Admitting: Family Medicine

## 2017-06-30 DIAGNOSIS — G4700 Insomnia, unspecified: Secondary | ICD-10-CM

## 2017-07-01 NOTE — Telephone Encounter (Signed)
Last seen 06/19/17  Dr Dettinger

## 2017-09-06 ENCOUNTER — Ambulatory Visit: Payer: BC Managed Care – PPO | Admitting: Pediatrics

## 2017-09-06 ENCOUNTER — Encounter: Payer: Self-pay | Admitting: Pediatrics

## 2017-09-06 VITALS — BP 111/75 | HR 79 | Temp 97.7°F | Ht 62.0 in | Wt 256.0 lb

## 2017-09-06 DIAGNOSIS — M5442 Lumbago with sciatica, left side: Secondary | ICD-10-CM | POA: Diagnosis not present

## 2017-09-06 MED ORDER — PREDNISONE 10 MG (21) PO TBPK: ORAL_TABLET | Freq: Every day | ORAL | 0 refills | Status: DC

## 2017-09-06 NOTE — Patient Instructions (Signed)
Back Exercises If you have pain in your back, do these exercises 2-3 times each day or as told by your doctor. When the pain goes away, do the exercises once each day, but repeat the steps more times for each exercise (do more repetitions). If you do not have pain in your back, do these exercises once each day or as told by your doctor. Exercises Single Knee to Chest  Do these steps 3-5 times in a row for each leg: 1. Lie on your back on a firm bed or the floor with your legs stretched out. 2. Bring one knee to your chest. 3. Hold your knee to your chest by grabbing your knee or thigh. 4. Pull on your knee until you feel a gentle stretch in your lower back. 5. Keep doing the stretch for 10-30 seconds. 6. Slowly let go of your leg and straighten it.  Pelvic Tilt  Do these steps 5-10 times in a row: 1. Lie on your back on a firm bed or the floor with your legs stretched out. 2. Bend your knees so they point up to the ceiling. Your feet should be flat on the floor. 3. Tighten your lower belly (abdomen) muscles to press your lower back against the floor. This will make your tailbone point up to the ceiling instead of pointing down to your feet or the floor. 4. Stay in this position for 5-10 seconds while you gently tighten your muscles and breathe evenly.  Cat-Cow  Do these steps until your lower back bends more easily: 1. Get on your hands and knees on a firm surface. Keep your hands under your shoulders, and keep your knees under your hips. You may put padding under your knees. 2. Let your head hang down, and make your tailbone point down to the floor so your lower back is round like the back of a cat. 3. Stay in this position for 5 seconds. 4. Slowly lift your head and make your tailbone point up to the ceiling so your back hangs low (sags) like the back of a cow. 5. Stay in this position for 5 seconds.  Press-Ups  Do these steps 5-10 times in a row: 1. Lie on your belly (face-down)  on the floor. 2. Place your hands near your head, about shoulder-width apart. 3. While you keep your back relaxed and keep your hips on the floor, slowly straighten your arms to raise the top half of your body and lift your shoulders. Do not use your back muscles. To make yourself more comfortable, you may change where you place your hands. 4. Stay in this position for 5 seconds. 5. Slowly return to lying flat on the floor.  Bridges  Do these steps 10 times in a row: 1. Lie on your back on a firm surface. 2. Bend your knees so they point up to the ceiling. Your feet should be flat on the floor. 3. Tighten your butt muscles and lift your butt off of the floor until your waist is almost as high as your knees. If you do not feel the muscles working in your butt and the back of your thighs, slide your feet 1-2 inches farther away from your butt. 4. Stay in this position for 3-5 seconds. 5. Slowly lower your butt to the floor, and let your butt muscles relax.  If this exercise is too easy, try doing it with your arms crossed over your chest. Back Lifts Do these steps 5-10 times in a   row: 1. Lie on your belly (face-down) with your arms at your sides, and rest your forehead on the floor. 2. Tighten the muscles in your legs and your butt. 3. Slowly lift your chest off of the floor while you keep your hips on the floor. Keep the back of your head in line with the curve in your back. Look at the floor while you do this. 4. Stay in this position for 3-5 seconds. 5. Slowly lower your chest and your face to the floor.  Contact a doctor if:  Your back pain gets a lot worse when you do an exercise.  Your back pain does not lessen 2 hours after you exercise. If you have any of these problems, stop doing the exercises. Do not do them again unless your doctor says it is okay. Get help right away if:  You have sudden, very bad back pain. If this happens, stop doing the exercises. Do not do them again  unless your doctor says it is okay. This information is not intended to replace advice given to you by your health care provider. Make sure you discuss any questions you have with your health care provider. Document Released: 02/03/2010 Document Revised: 06/09/2015 Document Reviewed: 02/25/2014 Elsevier Interactive Patient Education  2018 Elsevier Inc.   

## 2017-09-06 NOTE — Progress Notes (Signed)
  Subjective:   Patient ID: Ruth Rivers, female    DOB: 30-Apr-1975, 42 y.o.   MRN: [id number] CC: Back Pain  HPI: Ruth Rivers is a 42 y.o. female   Taking tylenol or 400-600mg  ibuprofen 2-3 times a day.  Continues to have back pain.  This flare started about a week ago.  She is first grade teacher, starting school next week.  She was seen in urgent care, treated with muscle relaxer, tizanidine.  Pain continues to bother her.  She says she always has a dull throb in her lower spine.  It gets worse with activity.  Sometimes depending on how she moves she will have a pain that shoots down her left leg.  That pain lasts for a second.  She had similar episode back pain almost 3 months ago, treated with prednisone at that time great improvement in her pain.  She is working on weight loss, is going to be following up with nutrition shortly.  She stays fairly active when she can, walking 30 minutes daily with her husband, was in the pool regularly prior to this flare back pain.  No trouble emptying her bladder/bowels, with weakness or tingling in her legs.  Relevant past medical, surgical, family and social history reviewed. Allergies and medications reviewed and updated. Social History   Tobacco Use  Smoking Status Never Smoker  Smokeless Tobacco Never Used   ROS: Per HPI   Objective:    BP 111/75   Pulse 79   Temp 97.7 F (36.5 C) (Oral)   Ht 5\' 2"  (1.575 m)   Wt 256 lb (116.1 kg)   BMI 46.82 kg/m   Wt Readings from Last 3 Encounters:  09/06/17 256 lb (116.1 kg)  06/19/17 244 lb (110.7 kg)  02/15/17 245 lb (111.1 kg)    Gen: NAD, alert, cooperative with exam, NCAT EYES: EOMI, no conjunctival injection, or no icterus CV: NRRR, normal S1/S2, no murmur, distal pulses 2+ b/l Resp: CTABL, no wheezes, normal WOB Ext: No edema, warm Neuro: Alert and oriented, strength equal b/l UE and LE, coordination grossly normal MSK: Mildly tender bilateral paraspinal muscles.  No point  tenderness over spine.  Assessment & Plan:  Ruth Rivers was seen today for back pain.  Diagnoses and all orders for this visit:  Acute midline low back pain with left-sided sciatica No red flag symptoms.  Will do another trial of prednisone.  Patient aware of need to avoid frequent use of prednisone.  Continue Tylenol, rest.  Gentle back exercises given.  Consider physical therapy in future.  Continue with weight loss efforts. -     predniSONE (STERAPRED UNI-PAK 21 TAB) 10 MG (21) TBPK tablet; Take by mouth daily. As directed x 6 days   Follow up plan: Return in about 3 months (around 12/07/2017).  To see PCP as scheduled. Assunta Found, MD [place]

## 2017-09-10 ENCOUNTER — Other Ambulatory Visit: Payer: Self-pay | Admitting: Family Medicine

## 2017-09-10 DIAGNOSIS — G4700 Insomnia, unspecified: Secondary | ICD-10-CM

## 2017-09-12 ENCOUNTER — Telehealth: Payer: Self-pay | Admitting: Family Medicine

## 2017-09-12 NOTE — Telephone Encounter (Signed)
Returned call, spoke with husband

## 2017-09-13 ENCOUNTER — Encounter: Payer: Self-pay | Admitting: Family Medicine

## 2017-09-13 ENCOUNTER — Telehealth: Payer: Self-pay | Admitting: Family Medicine

## 2017-09-14 ENCOUNTER — Encounter: Payer: Self-pay | Admitting: Family Medicine

## 2017-09-17 ENCOUNTER — Other Ambulatory Visit: Payer: Self-pay | Admitting: Family Medicine

## 2017-09-17 ENCOUNTER — Telehealth: Payer: Self-pay

## 2017-09-17 NOTE — Telephone Encounter (Signed)
We can go ahead and just do the neurosurgery referral, go ahead and put it in for her.

## 2017-09-17 NOTE — Telephone Encounter (Signed)
Patient is aware you are out of the office until tomorrow.Patient went to [facility] ER this weekend with severe back pain.  They did an MRI which shows disc protrusion at L3-4, spurs on L5-S1.  She is in severe pain and is wanting [place] to refer her to a neurosurgeon asap.  I made her an appointment to see you on Wednesday afternoon, but wondering if she actually needs to see you or if we can just go ahead and do a referral. She saw Dr. Evette Doffing on 8/23 for back pain and you on 6/5.  Please advise.

## 2017-09-18 ENCOUNTER — Ambulatory Visit: Payer: BC Managed Care – PPO | Admitting: Family Medicine

## 2017-09-18 ENCOUNTER — Encounter: Payer: Self-pay | Admitting: Family Medicine

## 2017-09-18 VITALS — BP 123/78 | HR 90 | Temp 97.9°F | Ht 62.0 in | Wt 259.0 lb

## 2017-09-18 DIAGNOSIS — M5442 Lumbago with sciatica, left side: Secondary | ICD-10-CM | POA: Diagnosis not present

## 2017-09-18 DIAGNOSIS — M48061 Spinal stenosis, lumbar region without neurogenic claudication: Secondary | ICD-10-CM

## 2017-09-18 DIAGNOSIS — M5116 Intervertebral disc disorders with radiculopathy, lumbar region: Secondary | ICD-10-CM | POA: Diagnosis not present

## 2017-09-18 DIAGNOSIS — M9983 Other biomechanical lesions of lumbar region: Secondary | ICD-10-CM

## 2017-09-18 MED ORDER — PREDNISONE 20 MG PO TABS: ORAL_TABLET | ORAL | 0 refills | Status: DC

## 2017-09-18 MED ORDER — TRAMADOL HCL 50 MG PO TABS
50.0000 mg | ORAL_TABLET | Freq: Three times a day (TID) | ORAL | 0 refills | Status: DC | PRN
Start: 2017-09-18 — End: 2018-05-28

## 2017-09-18 NOTE — Telephone Encounter (Signed)
Aware.  She wanted to keep her evening appointment with PCP.  She does not need a referral per her insurance.

## 2017-09-18 NOTE — Progress Notes (Signed)
BP 123/78   Pulse 90   Temp 97.9 F (36.6 C) (Oral)   Ht 5\' 2"  (1.575 m)   Wt 259 lb (117.5 kg)   BMI 47.37 kg/m    Subjective:    Patient ID: Ruth Rivers, female    DOB: [birth year], 42 y.o.   MRN: [id number]  HPI: Ruth Rivers is a 42 y.o. female presenting on 09/18/2017 for Hospitalization Follow-up (8/30[facility]- Has apt with Neuro Surgy 9/10)   HPI Lumbar disc disease and low back pain and spinal stenosis Is coming in for an ER follow-up for his pain and low back pain and spinal stenosis.  She was seen in the ER on 09/13/2017 at [facility] rocking him and is coming in to check it today to see if she needs an appointment for neurosurgery and see what else can be done.  She also want to make sure that we get a copy of the MRI.  She has done a course of prednisone to help but is also wondering if she can get something a little bit for the pain.  She denies any numbness or weakness but mainly has low back pain in a bandlike area across her back with sometimes intermittent left-sided sciatica going down her left hamstring.  Relevant past medical, surgical, family and social history reviewed and updated as indicated. Interim medical history since our last visit reviewed. Allergies and medications reviewed and updated.  Review of Systems  Constitutional: Negative for chills and fever.  Eyes: Negative for visual disturbance.  Respiratory: Negative for chest tightness and shortness of breath.   Cardiovascular: Negative for chest pain and leg swelling.  Musculoskeletal: Positive for back pain and myalgias. Negative for arthralgias and gait problem.  Skin: Negative for color change and rash.  Neurological: Negative for weakness, light-headedness, numbness and headaches.  Psychiatric/Behavioral: Negative for agitation and behavioral problems.  All other systems reviewed and are negative.   Per HPI unless specifically indicated above   Allergies as of 09/18/2017      Reactions   Cephalosporins Itching, Rash   Penicillins Rash      Medication List        Accurate as of 09/18/17  4:19 PM. Always use your most recent med list.          BRINTELLIX 10 MG Tabs tablet Generic drug:  vortioxetine HBr Take 5 mg by mouth 2 (two) times daily.   buPROPion 300 MG 24 hr tablet Commonly known as:  WELLBUTRIN XL   calcium-vitamin D 500-200 MG-UNIT tablet Commonly known as:  OSCAL WITH D Take 1 tablet by mouth daily.   clonazePAM 1 MG tablet Commonly known as:  KLONOPIN Take 0.5 mg by mouth as needed.   predniSONE 20 MG tablet Commonly known as:  DELTASONE Take 3 tabs daily for 1 week, then 2 tabs daily for week 2, then 1 tab daily for week 3.   traMADol 50 MG tablet Commonly known as:  ULTRAM Take 1 tablet (50 mg total) by mouth every 8 (eight) hours as needed.   traZODone 50 MG tablet Commonly known as:  DESYREL Take 50 mg by mouth at bedtime. 2 -3 tablets at bedtime          Objective:    BP 123/78   Pulse 90   Temp 97.9 F (36.6 C) (Oral)   Ht 5\' 2"  (1.575 m)   Wt 259 lb (117.5 kg)   BMI 47.37 kg/m   Wt Readings from Last 3  Encounters:  09/18/17 259 lb (117.5 kg)  09/06/17 256 lb (116.1 kg)  06/19/17 244 lb (110.7 kg)    Physical Exam  Constitutional: She is oriented to person, place, and time. She appears well-developed and well-nourished. No distress.  Eyes: Conjunctivae are normal.  Musculoskeletal: Normal range of motion.       Lumbar back: She exhibits tenderness (Low back pain in a bandlike area). She exhibits normal range of motion.  Neurological: She is alert and oriented to person, place, and time.  Skin: Skin is warm and dry. No rash noted. She is not diaphoretic.  Psychiatric: She has a normal mood and affect. Her behavior is normal.  Nursing note and vitals reviewed.   Patient had MRI of lumbar spine at [facility] him that showed small left-sided disc protrusion at L3-L4 which could affect the left L4 nerve root.  Mild  subarticular and foraminal stenosis on the left L5-S1 due to spurring.     Assessment & Plan:   Problem List Items Addressed This Visit    None    Visit Diagnoses    Acute midline low back pain with left-sided sciatica    -  Primary   Relevant Medications   predniSONE (DELTASONE) 20 MG tablet   traMADol (ULTRAM) 50 MG tablet   Foraminal stenosis of lumbar region       Relevant Medications   predniSONE (DELTASONE) 20 MG tablet   traMADol (ULTRAM) 50 MG tablet   Lumbar disc prolapse with compression radiculopathy       Relevant Medications   predniSONE (DELTASONE) 20 MG tablet   traMADol (ULTRAM) 50 MG tablet    Patient already has an appointment on 09/24/2017 to see neurosurgery, will do short course of prednisone again and tramadol to see if we can help her 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  Follow up plan: Return if symptoms worsen or fail to improve.  Counseling provided for all of the vaccine components No orders of the defined types were placed in this encounter.   [name], MD [facility] 09/18/2017, 4:19 PM

## 2017-09-19 NOTE — Telephone Encounter (Signed)
thanks

## 2017-09-25 ENCOUNTER — Other Ambulatory Visit: Payer: Self-pay | Admitting: Neurosurgery

## 2017-09-25 DIAGNOSIS — M5126 Other intervertebral disc displacement, lumbar region: Secondary | ICD-10-CM

## 2017-09-30 ENCOUNTER — Telehealth: Payer: Self-pay | Admitting: Family Medicine

## 2017-09-30 MED ORDER — OSELTAMIVIR PHOSPHATE 75 MG PO CAPS: 75.0000 mg | ORAL_CAPSULE | Freq: Every day | ORAL | 0 refills | Status: DC

## 2017-09-30 NOTE — Telephone Encounter (Signed)
Pt aware.

## 2017-09-30 NOTE — Telephone Encounter (Signed)
Please let patient know I sent in the prophylaxis Tamiflu for her

## 2017-10-02 ENCOUNTER — Ambulatory Visit
Admission: RE | Admit: 2017-10-02 | Discharge: 2017-10-02 | Disposition: A | Discharge status: Home / Self Care | Payer: BC Managed Care – PPO | Source: Ambulatory Visit | Attending: Neurosurgery | Admitting: Neurosurgery

## 2017-10-02 ENCOUNTER — Other Ambulatory Visit: Payer: Self-pay | Admitting: Family Medicine

## 2017-10-02 DIAGNOSIS — M5442 Lumbago with sciatica, left side: Secondary | ICD-10-CM

## 2017-10-02 DIAGNOSIS — M5116 Intervertebral disc disorders with radiculopathy, lumbar region: Secondary | ICD-10-CM

## 2017-10-02 DIAGNOSIS — M5126 Other intervertebral disc displacement, lumbar region: Secondary | ICD-10-CM

## 2017-10-02 DIAGNOSIS — M48061 Spinal stenosis, lumbar region without neurogenic claudication: Secondary | ICD-10-CM

## 2017-10-02 MED ORDER — IOPAMIDOL (ISOVUE-M 200) INJECTION 41%
1.0000 mL | Freq: Once | INTRAMUSCULAR | Status: AC
  Administered 2017-10-02: 1 mL via EPIDURAL

## 2017-10-02 MED ORDER — METHYLPREDNISOLONE ACETATE 40 MG/ML INJ SUSP (RADIOLOG
120.0000 mg | Freq: Once | INTRAMUSCULAR | Status: AC
  Administered 2017-10-02: 120 mg via EPIDURAL

## 2017-10-02 NOTE — Discharge Instructions (Signed)

## 2017-10-04 ENCOUNTER — Other Ambulatory Visit: Payer: Self-pay | Admitting: Family Medicine

## 2017-10-04 ENCOUNTER — Telehealth: Payer: Self-pay | Admitting: Family Medicine

## 2017-10-04 DIAGNOSIS — M5442 Lumbago with sciatica, left side: Secondary | ICD-10-CM

## 2017-10-04 DIAGNOSIS — M5116 Intervertebral disc disorders with radiculopathy, lumbar region: Secondary | ICD-10-CM

## 2017-10-04 DIAGNOSIS — M48061 Spinal stenosis, lumbar region without neurogenic claudication: Secondary | ICD-10-CM

## 2017-10-04 NOTE — Telephone Encounter (Signed)
FYI from patient, this would be denied, policy is patient would need to be seen for these refills

## 2017-10-28 ENCOUNTER — Telehealth: Payer: Self-pay | Admitting: Family Medicine

## 2017-10-28 ENCOUNTER — Other Ambulatory Visit: Payer: Self-pay | Admitting: Nurse Practitioner

## 2017-10-28 DIAGNOSIS — M5126 Other intervertebral disc displacement, lumbar region: Secondary | ICD-10-CM

## 2017-10-28 MED ORDER — PROMETHAZINE HCL 25 MG PO TABS: 25.0000 mg | ORAL_TABLET | Freq: Three times a day (TID) | ORAL | 0 refills | Status: DC | PRN

## 2017-10-28 NOTE — Telephone Encounter (Signed)
Patient aware.

## 2017-10-28 NOTE — Telephone Encounter (Signed)
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

## 2017-10-28 NOTE — Telephone Encounter (Signed)
I sent in the Phenergan

## 2017-11-11 ENCOUNTER — Ambulatory Visit
Admission: RE | Admit: 2017-11-11 | Discharge: 2017-11-11 | Disposition: A | Discharge status: Home / Self Care | Payer: BC Managed Care – PPO | Source: Ambulatory Visit | Attending: Nurse Practitioner | Admitting: Nurse Practitioner

## 2017-11-11 DIAGNOSIS — M5126 Other intervertebral disc displacement, lumbar region: Secondary | ICD-10-CM

## 2017-11-11 MED ORDER — METHYLPREDNISOLONE ACETATE 40 MG/ML INJ SUSP (RADIOLOG
120.0000 mg | Freq: Once | INTRAMUSCULAR | Status: AC
  Administered 2017-11-11: 120 mg via EPIDURAL

## 2017-11-11 MED ORDER — IOPAMIDOL (ISOVUE-M 200) INJECTION 41%
1.0000 mL | Freq: Once | INTRAMUSCULAR | Status: AC
  Administered 2017-11-11: 1 mL via EPIDURAL

## 2017-12-02 ENCOUNTER — Other Ambulatory Visit: Payer: Self-pay | Admitting: Nurse Practitioner

## 2017-12-02 DIAGNOSIS — M5126 Other intervertebral disc displacement, lumbar region: Secondary | ICD-10-CM

## 2017-12-11 ENCOUNTER — Ambulatory Visit
Admission: RE | Admit: 2017-12-11 | Discharge: 2017-12-11 | Disposition: A | Discharge status: Home / Self Care | Payer: BC Managed Care – PPO | Source: Ambulatory Visit | Attending: Nurse Practitioner | Admitting: Nurse Practitioner

## 2017-12-11 DIAGNOSIS — M5126 Other intervertebral disc displacement, lumbar region: Secondary | ICD-10-CM

## 2017-12-11 MED ORDER — IOPAMIDOL (ISOVUE-M 200) INJECTION 41%
1.0000 mL | Freq: Once | INTRAMUSCULAR | Status: AC
  Administered 2017-12-11: 1 mL via EPIDURAL

## 2017-12-11 MED ORDER — METHYLPREDNISOLONE ACETATE 40 MG/ML INJ SUSP (RADIOLOG
120.0000 mg | Freq: Once | INTRAMUSCULAR | Status: AC
  Administered 2017-12-11: 120 mg via EPIDURAL

## 2017-12-11 NOTE — Discharge Instructions (Signed)

## 2017-12-18 ENCOUNTER — Other Ambulatory Visit: Payer: Self-pay | Admitting: Family Medicine

## 2017-12-18 DIAGNOSIS — G4700 Insomnia, unspecified: Secondary | ICD-10-CM

## 2017-12-18 NOTE — Telephone Encounter (Signed)
Last seen 11/29/17

## 2017-12-18 NOTE — Telephone Encounter (Signed)
We can approve the refill this time but with the changing policies and controlled substance, from here on out since Ambien has been made controlled she will have to get it filled in an office visit.  This will have 30 days with 2 refills so that is 3 months but she will need to be seen before the next time she has to call for refill.

## 2017-12-23 ENCOUNTER — Ambulatory Visit: Payer: BC Managed Care – PPO | Admitting: Family Medicine

## 2018-02-18 ENCOUNTER — Telehealth: Payer: Self-pay

## 2018-02-18 DIAGNOSIS — Z8739 Personal history of other diseases of the musculoskeletal system and connective tissue: Secondary | ICD-10-CM

## 2018-02-18 NOTE — Telephone Encounter (Signed)
Go ahead and do a referral to a new neurosurgery for the patient, diagnosis chronic degenerative disc disease

## 2018-02-18 NOTE — Telephone Encounter (Signed)
I have been going to Neurosurgeon Dr Carloyn Manner and he is retiring in April so I need to be referred elsewhere

## 2018-02-19 NOTE — Telephone Encounter (Signed)
Patient aware we will be doing new referral to neurosurgeon

## 2018-02-19 NOTE — Addendum Note (Signed)
Addended by: Michaela Corner on: 02/19/2018 09:15 AM   Modules accepted: Orders

## 2018-03-17 ENCOUNTER — Other Ambulatory Visit: Payer: Self-pay | Admitting: Family Medicine

## 2018-03-17 DIAGNOSIS — G4700 Insomnia, unspecified: Secondary | ICD-10-CM

## 2018-03-30 ENCOUNTER — Encounter: Payer: Self-pay | Admitting: Internal Medicine

## 2018-05-15 ENCOUNTER — Encounter: Payer: Self-pay | Admitting: Family Medicine

## 2018-05-15 ENCOUNTER — Telehealth: Payer: Self-pay

## 2018-05-15 ENCOUNTER — Ambulatory Visit (INDEPENDENT_AMBULATORY_CARE_PROVIDER_SITE_OTHER): Payer: BC Managed Care – PPO | Admitting: Family Medicine

## 2018-05-15 ENCOUNTER — Other Ambulatory Visit: Payer: Self-pay

## 2018-05-15 ENCOUNTER — Encounter (INDEPENDENT_AMBULATORY_CARE_PROVIDER_SITE_OTHER): Payer: Self-pay

## 2018-05-15 ENCOUNTER — Other Ambulatory Visit: Payer: Self-pay | Admitting: *Deleted

## 2018-05-15 DIAGNOSIS — J069 Acute upper respiratory infection, unspecified: Secondary | ICD-10-CM

## 2018-05-15 NOTE — Progress Notes (Signed)
Virtual Visit via telephone Note  I connected with Ruth Rivers on 05/15/18 at 1600 by telephone and verified that I am speaking with the correct person using two identifiers. Ruth Rivers is currently located at home and no other people are currently with her during visit. The provider, Fransisca Kaufmann , MD is located in their office at time of visit.  Call ended at 1611  I discussed the limitations, risks, security and privacy concerns of performing an evaluation and management service by telephone and the availability of in person appointments. I also discussed with the patient that there may be a patient responsible charge related to this service. The patient expressed understanding and agreed to proceed.   History and Present Illness: She has a sore throat, headache and coughing that has been going on for 2 days. Has fevers of 100.2 and body aches and chills. She denies any shortness of breath.  She has a nasty productive cough and has been using lozenges and tylenol, with some benefit. No sick contacts that she knows. She also had vomiting 2 nights ago but has been keeping fod and fluids down.   No diagnosis found.  Outpatient Encounter Medications as of 05/15/2018  Medication Sig  . buPROPion (WELLBUTRIN XL) 300 MG 24 hr tablet   . calcium-vitamin D (OSCAL WITH D) 500-200 MG-UNIT tablet Take 1 tablet by mouth daily.  . clonazePAM (KLONOPIN) 1 MG tablet Take 0.5 mg by mouth as needed.   Marland Kitchen oseltamivir (TAMIFLU) 75 MG capsule Take 1 capsule (75 mg total) by mouth daily.  . predniSONE (DELTASONE) 20 MG tablet Take 3 tabs daily for 1 week, then 2 tabs daily for week 2, then 1 tab daily for week 3.  . promethazine (PHENERGAN) 25 MG tablet Take 1 tablet (25 mg total) by mouth every 8 (eight) hours as needed for nausea or vomiting.  . traMADol (ULTRAM) 50 MG tablet Take 1 tablet (50 mg total) by mouth every 8 (eight) hours as needed.  . traZODone (DESYREL) 50 MG tablet Take 50 mg by mouth  at bedtime. 2 -3 tablets at bedtime  . Vortioxetine HBr (BRINTELLIX) 10 MG TABS Take 5 mg by mouth 2 (two) times daily.   Marland Kitchen zolpidem (AMBIEN CR) 6.25 MG CR tablet TAKE 1 TO 2 TABLETS AT BEDTIME AS NEEEDED FOR SLEEP   No facility-administered encounter medications on file as of 05/15/2018.     Review of Systems  Constitutional: Positive for chills and fever.  HENT: Positive for congestion, postnasal drip, rhinorrhea, sinus pressure, sneezing and sore throat. Negative for ear discharge and ear pain.   Eyes: Negative for pain, redness and visual disturbance.  Respiratory: Positive for cough. Negative for chest tightness and shortness of breath.   Cardiovascular: Negative for chest pain and leg swelling.  Genitourinary: Negative for difficulty urinating and dysuria.  Musculoskeletal: Positive for myalgias. Negative for back pain and gait problem.  Skin: Negative for rash.  Neurological: Negative for light-headedness and headaches.  Psychiatric/Behavioral: Negative for agitation and behavioral problems.  All other systems reviewed and are negative.   Observations/Objective: Patient sounds comfortable and in no acute distress  Assessment and Plan: Problem List Items Addressed This Visit    None    Visit Diagnoses    Upper respiratory tract infection, unspecified type    -  Primary   Relevant Orders   MYCHART COVID-19 HOME MONITORING PROGRAM   MyChart Temperature FLOWSHEET   Temperature monitoring       Follow Up  Instructions:  Quarantine for 2 weeks and monitor and mucinex and recommended monitoring and conservative   I discussed the assessment and treatment plan with the patient. The patient was provided an opportunity to ask questions and all were answered. The patient agreed with the plan and demonstrated an understanding of the instructions.   The patient was advised to call back or seek an in-person evaluation if the symptoms worsen or if the condition fails to improve as  anticipated.  The above assessment and management plan was discussed with the patient. The patient verbalized understanding of and has agreed to the management plan. Patient is aware to call the clinic if symptoms persist or worsen. Patient is aware when to return to the clinic for a follow-up visit. Patient educated on when it is appropriate to go to the emergency department.    I provided 11 minutes of non-face-to-face time during this encounter.    Worthy Rancher, MD

## 2018-05-15 NOTE — Telephone Encounter (Signed)
Pt stated that there symptoms are the same have not gotten worse , she accidentally click the wrong thing in her my chart acct. I told pt to continue to monitor her symptoms.

## 2018-05-16 ENCOUNTER — Encounter (INDEPENDENT_AMBULATORY_CARE_PROVIDER_SITE_OTHER): Payer: Self-pay

## 2018-05-17 ENCOUNTER — Encounter (INDEPENDENT_AMBULATORY_CARE_PROVIDER_SITE_OTHER): Payer: Self-pay

## 2018-05-18 ENCOUNTER — Encounter (INDEPENDENT_AMBULATORY_CARE_PROVIDER_SITE_OTHER): Payer: Self-pay

## 2018-05-18 ENCOUNTER — Telehealth: Payer: Self-pay | Admitting: Family Medicine

## 2018-05-18 ENCOUNTER — Telehealth: Payer: Self-pay

## 2018-05-18 MED ORDER — BENZONATATE 200 MG PO CAPS: 200.0000 mg | ORAL_CAPSULE | Freq: Three times a day (TID) | ORAL | 0 refills | Status: DC | PRN

## 2018-05-18 MED ORDER — PROMETHAZINE-DM 6.25-15 MG/5ML PO SYRP: 2.5000 mL | ORAL_SOLUTION | Freq: Four times a day (QID) | ORAL | 0 refills | Status: DC | PRN

## 2018-05-18 NOTE — Telephone Encounter (Signed)
Error with system

## 2018-05-18 NOTE — Telephone Encounter (Signed)
Error system created multiple calls

## 2018-05-18 NOTE — Telephone Encounter (Signed)
Called pt in response to COVID 19 mychart companion message of worsening cough after speaking with CMA. Pt reiterates no wheezing, SOB, fevers today just a dry hacking cough that is not relieved by OTC remedies. Having difficulty sleeping due to this cough. Denies hx of asthma, smoking, pneumonia or other chronic pulmonary conditions. Speaking comfortably without evidence of respiratory distress over the phone. Patient agreeable to trying tessalon perles and phenergan DM (advised this one can make you sleepy). Discussed at length to call/message back if not improving or go to UC/ER if worsening. Patient verbalizes understanding of this plan and agrees she will seek care immediately if worsening. Medications sent to her preferred pharmacy.

## 2018-05-18 NOTE — Telephone Encounter (Signed)
Error

## 2018-05-18 NOTE — Telephone Encounter (Signed)
Covid BPA call back triggered after complaint of worsening cough and LOA. Patient has taken OTC cough meds with no relief. Complains of non productive cough that interrupts sleep. Also complains of exhaustion and weakness. Denies fever, SOB, Wheezing, and NVD at this time. Verified meds and allergies and instructed her to follow care plan and await further instructions from on call provider or myself. Contacted on call provider since PCP closed. On call provider agrees to contact patient.    Patient Questionnaire Submission  --------------------------------   Questionnaire: COVID-19 Condition Monitoring   Question: Are you feeling short of breath today?  Answer:  No   Question: Are you having a cough today?  Answer:  Yes   Question: Is the cough better, the same, or worse than yesterday?  Answer:  Worse   Question: Are you experiencing weakness today?  Answer:  Yes   Question: Is the weakness better, the same, or worse than yesterday?  Answer:  Same   Question: Are you vomiting?  Answer:  No   Question: How is your appetite compared to yesterday?  Answer:  Worse   Question: Are you experiencing diarrhea?  Answer:  No

## 2018-05-18 NOTE — Telephone Encounter (Deleted)
  Responded to BPA Triggered from Covid Screening. Patient having worsening cough. Non productive. She did try OTC cough meds which were non effective. Patient complains of interrupted sleep and exhaustion. Verified meds and allergies. No new fever since yesterday but LOA present. Denies SOB and wheezing at this time. Instructed patient to continue care plan and await further instructions from myself or the on- call provider. Advised on call provider of the interrupted sleep due to cough and LOA with exhaustion and weakness and she agrees to contact patient and will call me if needs me any further.    Patient Questionnaire Submission  --------------------------------   Questionnaire: COVID-19 Condition Monitoring   Question: Are you feeling short of breath today?  Answer:  No   Question: Are you having a cough today?  Answer:  Yes   Question: Is the cough better, the same, or worse than yesterday?  Answer:  Worse   Question: Are you experiencing weakness today?  Answer:  Yes   Question: Is the weakness better, the same, or worse than yesterday?  Answer:  Same   Question: Are you vomiting?  Answer:  No   Question: How is your appetite compared to yesterday?  Answer:  Worse   Question: Are you experiencing diarrhea?  Answer:  No

## 2018-05-19 ENCOUNTER — Encounter (INDEPENDENT_AMBULATORY_CARE_PROVIDER_SITE_OTHER): Payer: Self-pay

## 2018-05-20 ENCOUNTER — Encounter (INDEPENDENT_AMBULATORY_CARE_PROVIDER_SITE_OTHER): Payer: Self-pay

## 2018-05-20 ENCOUNTER — Telehealth: Payer: Self-pay | Admitting: Family Medicine

## 2018-05-20 NOTE — Telephone Encounter (Signed)
Contacted pt regarding her my chart companion response of vomiting; the pt says that today she has had 6 oz of water, and 12 oz sprite; the pt says that her vomiting is not related to her cough, but to nausea; advised  pt to drink so that her urine remains clear to straw colored, try to drink fluids if tolerated and work their way up to bland foods; and if she has more than 6 episodes of vomiting today or >8 hours to seek treatment in the ED; pt also instructed to contact her PCP for advisement of her nausea; she verbalized understanding; she normally sees Dr Vonna Kotyk Dettinger.

## 2018-05-21 ENCOUNTER — Telehealth: Payer: Self-pay | Admitting: *Deleted

## 2018-05-21 ENCOUNTER — Encounter (INDEPENDENT_AMBULATORY_CARE_PROVIDER_SITE_OTHER): Payer: Self-pay

## 2018-05-21 MED ORDER — PROMETHAZINE HCL 25 MG PO TABS: 25.0000 mg | ORAL_TABLET | Freq: Three times a day (TID) | ORAL | 0 refills | Status: DC | PRN

## 2018-05-21 NOTE — Telephone Encounter (Signed)
Attempted to call pt x 2 but no answer and busy signal received. MyChart message sent to pt to call Dr. Warrick Parisian for follow up regarding worsening cough and vomiting. [place] called and spoke with Mitzy who states she would make Dr. Warrick Parisian aware.

## 2018-05-21 NOTE — Addendum Note (Signed)
Addended by: [name] on: 05/21/2018 01:02 PM   Modules accepted: Orders

## 2018-05-21 NOTE — Telephone Encounter (Signed)
Spoke with patient briefly and said that she did finally talk to the nursing people and that she is actually starting to feel better.  She has not had any fevers today and besides the nausea she is actually feeling a lot better.  She is still keeping fluid intake down and denies any shortness of breath or wheezing. She will continue to monitor and let us know if anything worsens. [name], MD [facility] 05/21/2018, 1:02 PM

## 2018-05-22 ENCOUNTER — Encounter (INDEPENDENT_AMBULATORY_CARE_PROVIDER_SITE_OTHER): Payer: Self-pay

## 2018-05-23 ENCOUNTER — Encounter (INDEPENDENT_AMBULATORY_CARE_PROVIDER_SITE_OTHER): Payer: Self-pay

## 2018-05-24 ENCOUNTER — Encounter (INDEPENDENT_AMBULATORY_CARE_PROVIDER_SITE_OTHER): Payer: Self-pay

## 2018-05-25 ENCOUNTER — Telehealth: Payer: Self-pay

## 2018-05-25 NOTE — Progress Notes (Unsigned)
Please continue to care plan as we have discussed. Please see phone note

## 2018-05-25 NOTE — Telephone Encounter (Signed)
Patient submitted a COVID-19 screening, and she mention that her cough was Worse.  I call the patient and had her to describe to me her symptoms of her cough.  Patient stated that it's still the same as it has been over the last few days, but now it is just more frequent.    She also said that she doesn't think cough medicine and the tessalon pearls are working for her very well.   Advised patient to continue with her medication as the doctor had prescribed and to take two tablespoons of honey before bedtime, also to drink warm fluids, and to try cough drops or hard candy.  Patient voiced understanding. Nothing further was needed

## 2018-05-26 ENCOUNTER — Encounter (INDEPENDENT_AMBULATORY_CARE_PROVIDER_SITE_OTHER): Payer: Self-pay

## 2018-05-26 ENCOUNTER — Telehealth: Payer: Self-pay | Admitting: *Deleted

## 2018-05-26 ENCOUNTER — Telehealth: Payer: Self-pay | Admitting: Family Medicine

## 2018-05-26 DIAGNOSIS — J069 Acute upper respiratory infection, unspecified: Secondary | ICD-10-CM

## 2018-05-26 MED ORDER — HYDROCODONE-HOMATROPINE 5-1.5 MG/5ML PO SYRP: 5.0000 mL | ORAL_SOLUTION | Freq: Four times a day (QID) | ORAL | 0 refills | Status: DC | PRN

## 2018-05-26 NOTE — Telephone Encounter (Signed)
Received BPA for worsening cough.See 2nd telephone encounter from 05/26/18. Pt was seen for virtual visit with provider today and prescribed medication for cough.

## 2018-05-26 NOTE — Telephone Encounter (Signed)
Virtual Visit via telephone Note  I connected with Ruth Rivers on 05/26/18  at 1607 by telephone and verified that I am speaking with the correct person using two identifiers. Ruth Rivers is currently located at home and no other people are currently with her during visit. The provider, Fransisca Kaufmann Geroge Gilliam, MD is located in their office at time of visit.  Call ended at 1614  I discussed the limitations, risks, security and privacy concerns of performing an evaluation and management service by telephone and the availability of in person appointments. I also discussed with the patient that there may be a patient responsible charge related to this service. The patient expressed understanding and agreed to proceed.   History and Present Illness: Patients cough is worse at night, she does not feel like it is getting into chest.  She is still mostly having the respiratory sinus and drainage and postnasal drainage.  She says she still has some body aches and chills but less of a fever and she has been having and she denies any shortness of breath or wheezing.  She has been using different cough syrups and over-the-counter medicines to manage it which have been helping.  No diagnosis found.  Outpatient Encounter Medications as of 05/26/2018  Medication Sig  . benzonatate (TESSALON) 200 MG capsule Take 1 capsule (200 mg total) by mouth 3 (three) times daily as needed.  Marland Kitchen buPROPion (WELLBUTRIN XL) 300 MG 24 hr tablet   . calcium-vitamin D (OSCAL WITH D) 500-200 MG-UNIT tablet Take 1 tablet by mouth daily.  . clonazePAM (KLONOPIN) 1 MG tablet Take 0.5 mg by mouth as needed.   . promethazine (PHENERGAN) 25 MG tablet Take 1 tablet (25 mg total) by mouth every 8 (eight) hours as needed for nausea or vomiting.  . promethazine-dextromethorphan (PROMETHAZINE-DM) 6.25-15 MG/5ML syrup Take 2.5 mLs by mouth 4 (four) times daily as needed for cough.  . traMADol (ULTRAM) 50 MG tablet Take 1 tablet (50 mg total) by  mouth every 8 (eight) hours as needed.  . traZODone (DESYREL) 50 MG tablet Take 50 mg by mouth at bedtime. 2 -3 tablets at bedtime  . Vortioxetine HBr (BRINTELLIX) 10 MG TABS Take 5 mg by mouth 2 (two) times daily.   Marland Kitchen zolpidem (AMBIEN CR) 6.25 MG CR tablet TAKE 1 TO 2 TABLETS AT BEDTIME AS NEEEDED FOR SLEEP   No facility-administered encounter medications on file as of 05/26/2018.     Review of Systems - General ROS: positive for  - chills and malaise negative for - fever ENT ROS: positive for - headaches and nasal congestion negative for - sinus pain or sore throat Respiratory ROS: positive for - cough negative for - shortness of breath or wheezing Cardiovascular ROS: no chest pain or dyspnea on exertion  Observations/Objective: Patient sounds comfortable on the phone and in no acute distress  Assessment and Plan: Problem List Items Addressed This Visit      Respiratory   Upper respiratory infection   Relevant Medications   HYDROcodone-homatropine (HYCODAN) 5-1.5 MG/5ML syrup       Follow Up Instructions:  She has been continue to monitor for COVID at home through the my chart monitoring and we got that we need to call her today.  Sent her a different Hycodan cough syrup and will extend her work note to be off through the rest this week because she still having symptoms.   I discussed the assessment and treatment plan with the patient. The patient was provided  an opportunity to ask questions and all were answered. The patient agreed with the plan and demonstrated an understanding of the instructions.   The patient was advised to call back or seek an in-person evaluation if the symptoms worsen or if the condition fails to improve as anticipated.  The above assessment and management plan was discussed with the patient. The patient verbalized understanding of and has agreed to the management plan. Patient is aware to call the clinic if symptoms persist or worsen. Patient is aware  when to return to the clinic for a follow-up visit. Patient educated on when it is appropriate to go to the emergency department.    I provided 7 minutes of non-face-to-face time during this encounter.    Worthy Rancher, MD

## 2018-05-26 NOTE — Telephone Encounter (Signed)
Note sent to [facility] as patient requested.

## 2018-05-27 ENCOUNTER — Encounter (INDEPENDENT_AMBULATORY_CARE_PROVIDER_SITE_OTHER): Payer: Self-pay

## 2018-05-28 ENCOUNTER — Encounter (INDEPENDENT_AMBULATORY_CARE_PROVIDER_SITE_OTHER): Payer: Self-pay

## 2018-05-28 ENCOUNTER — Other Ambulatory Visit: Payer: Self-pay | Admitting: *Deleted

## 2018-07-01 ENCOUNTER — Other Ambulatory Visit: Payer: Self-pay

## 2018-07-01 ENCOUNTER — Ambulatory Visit: Payer: BC Managed Care – PPO

## 2018-07-01 VITALS — Ht 62.0 in | Wt 260.0 lb

## 2018-07-01 DIAGNOSIS — Z8371 Family history of colonic polyps: Secondary | ICD-10-CM

## 2018-07-01 DIAGNOSIS — Z86010 Personal history of colonic polyps: Secondary | ICD-10-CM

## 2018-07-01 MED ORDER — SUPREP BOWEL PREP KIT 17.5-3.13-1.6 GM/177ML PO SOLN: 1.0000 | Freq: Once | ORAL | 0 refills | Status: AC

## 2018-07-01 NOTE — Progress Notes (Signed)
Per pt, no allergies to soy or egg products.Pt not taking any weight loss meds or using  O2 at home. Pt denies sedation problems.  Pt refused emmi video.  The PV was done over the phone due to COVID-19. I verified the pt's address and insurance. Reviewed medical history and prep instructions with the pt and will mail her the instructions.  Informed pt to call with any questions or changes before her procedure. Pt understood.

## 2018-07-02 ENCOUNTER — Encounter: Payer: Self-pay | Admitting: Internal Medicine

## 2018-07-09 ENCOUNTER — Telehealth: Payer: Self-pay | Admitting: Internal Medicine

## 2018-07-09 NOTE — Telephone Encounter (Signed)
Spoke with patient regarding Covid-19 screening questions °Covid-19 Screening Questions: ° °Do you now or have you had a fever in the last 14 days? No    °Do you have any respiratory symptoms of shortness of breath or cough now or in the last 14 days? no ° °Do you have any family members or close contacts with diagnosed or suspected Covid-19 in the past 14 days? no ° °Have you been tested for Covid-19 and found to be positive? no ° °Pt made aware of that care partner may wait in the car or come up to the lobby during the procedure but will need to provide their own mask. °

## 2018-07-10 ENCOUNTER — Other Ambulatory Visit: Payer: Self-pay

## 2018-07-10 ENCOUNTER — Ambulatory Visit (AMBULATORY_SURGERY_CENTER): Payer: BC Managed Care – PPO | Admitting: Internal Medicine

## 2018-07-10 ENCOUNTER — Encounter: Payer: Self-pay | Admitting: Internal Medicine

## 2018-07-10 ENCOUNTER — Encounter: Payer: BC Managed Care – PPO | Admitting: Internal Medicine

## 2018-07-10 VITALS — BP 131/75 | HR 76 | Temp 98.0°F | Resp 13 | Ht 62.0 in | Wt 259.0 lb

## 2018-07-10 DIAGNOSIS — D124 Benign neoplasm of descending colon: Secondary | ICD-10-CM | POA: Diagnosis not present

## 2018-07-10 DIAGNOSIS — Z86010 Personal history of colonic polyps: Secondary | ICD-10-CM

## 2018-07-10 MED ORDER — SODIUM CHLORIDE 0.9 % IV SOLN
4.0000 mg | Freq: Once | INTRAVENOUS | Status: AC
  Administered 2018-07-10: 4 mg via INTRAVENOUS

## 2018-07-10 MED ORDER — SODIUM CHLORIDE 0.9 % IV SOLN: 4.0000 mg | Freq: Once | INTRAVENOUS | Status: DC

## 2018-07-10 MED ORDER — SODIUM CHLORIDE 0.9 % IV SOLN: 500.0000 mL | Freq: Once | INTRAVENOUS | Status: DC

## 2018-07-10 NOTE — Patient Instructions (Addendum)
Handout given for polyps.  YOU HAD AN ENDOSCOPIC PROCEDURE TODAY AT THE [facility]:   Refer to the procedure report that was given to you for any specific questions about what was found during the examination.  If the procedure report does not answer your questions, please call your gastroenterologist to clarify.  If you requested that your care partner not be given the details of your procedure findings, then the procedure report has been included in a sealed envelope for you to review at your convenience later.  YOU SHOULD EXPECT: Some feelings of bloating in the abdomen. Passage of more gas than usual.  Walking can help get rid of the air that was put into your GI tract during the procedure and reduce the bloating. If you had a lower endoscopy (such as a colonoscopy or flexible sigmoidoscopy) you may notice spotting of blood in your stool or on the toilet paper. If you underwent a bowel prep for your procedure, you may not have a normal bowel movement for a few days.  Please Note:  You might notice some irritation and congestion in your nose or some drainage.  This is from the oxygen used during your procedure.  There is no need for concern and it should clear up in a day or so.  SYMPTOMS TO REPORT IMMEDIATELY:   Following lower endoscopy (colonoscopy or flexible sigmoidoscopy):  Excessive amounts of blood in the stool  Significant tenderness or worsening of abdominal pains  Swelling of the abdomen that is new, acute  Fever of 100F or higher  For urgent or emergent issues, a gastroenterologist can be reached at any hour by calling [phone].   DIET:  We do recommend a small meal at first, but then you may proceed to your regular diet.  Drink plenty of fluids but you should avoid alcoholic beverages for 24 hours.  ACTIVITY:  You should plan to take it easy for the rest of today and you should NOT DRIVE or use heavy machinery until tomorrow (because of the sedation  medicines used during the test).    FOLLOW UP: Our staff will call the number listed on your records 48-72 hours following your procedure to check on you and address any questions or concerns that you may have regarding the information given to you following your procedure. If we do not reach you, we will leave a message.  We will attempt to reach you two times.  During this call, we will ask if you have developed any symptoms of COVID 19. If you develop any symptoms (ie: fever, flu-like symptoms, shortness of breath, cough etc.) before then, please call [phone].  If you test positive for Covid 19 in the 2 weeks post procedure, please call and report this information to us.    If any biopsies were taken you will be contacted by phone or by letter within the next 1-3 weeks.  Please call us at [phone] if you have not heard about the biopsies in 3 weeks.    SIGNATURES/CONFIDENTIALITY: You and/or your care partner have signed paperwork which will be entered into your electronic medical record.  These signatures attest to the fact that that the information above on your After Visit Summary has been reviewed and is understood.  Full responsibility of the confidentiality of this discharge information lies with you and/or your care-partner. 

## 2018-07-10 NOTE — Op Note (Signed)
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 The patient's tolerance of                            previous anesthesia was also reviewed. The risks                            and benefits of the procedure and the sedation                            options and risks were discussed with the patient.                            All questions were answered, and informed consent                            was obtained. Prior Anticoagulants: The patient has                            taken no previous anticoagulant or antiplatelet                            agents except for aspirin. ASA Grade Assessment:                            III - A patient with severe systemic disease. After                            reviewing the risks and benefits, the patient was  deemed in satisfactory condition to undergo the                            procedure.                           After obtaining informed consent, the colonoscope                            was passed under direct vision. Throughout the                            procedure, the patient's blood pressure, pulse, and                            oxygen saturations were monitored continuously. The                          Colonoscope was introduced through the anus and                            advanced to the cecum, identified by appendiceal                            orifice and ileocecal valve. The colonoscopy was                            performed without difficulty. The patient tolerated                            the procedure well. The quality of the bowel                            preparation was good. The ileocecal valve,                            appendiceal orifice, and rectum were photographed. Scope In: 1:12:09 PM Scope Out: 1:29:15 PM Scope Withdrawal Time: 0 hours 11 minutes 31 seconds  Total Procedure Duration: 0 hours 17 minutes 6 seconds  Findings:                 The digital rectal exam was normal.                           A 7 mm polyp was found in the descending colon. The                            polyp was sessile. The polyp was removed with a                            cold snare. Resection and retrieval were complete.                           The exam was otherwise without abnormality on  direct and retroflexion views. Complications:            No immediate complications. Estimated Blood Loss:     Estimated blood loss was minimal. Impression:               - One 7 mm polyp in the descending colon, removed                            with a cold snare. Resected and retrieved.                           - The examination was otherwise normal on direct                            and retroflexion views. Recommendation:           - Patient has a contact number available for                            emergencies. The signs and symptoms of potential                            delayed complications were discussed with the                            patient. Return to normal activities tomorrow.                            Written discharge instructions were provided to the                            patient.                           -  Resume previous diet.                           - Continue present medications.                           - Await pathology results.                           - Repeat colonoscopy is recommended for                            surveillance. The colonoscopy date will be                            determined after pathology results from today's                            exam become available for review. Jerene Bears, MD 07/10/2018 1:35:32 PM This report has been signed electronically.

## 2018-07-10 NOTE — Progress Notes (Signed)
Pt's states no medical or surgical changes since previsit or office visit. 

## 2018-07-10 NOTE — Progress Notes (Signed)
Called to room to assist during endoscopic procedure.  Patient ID and intended procedure confirmed with present staff. Received instructions for my participation in the procedure from the performing physician.  

## 2018-07-10 NOTE — Progress Notes (Signed)
Pt c/o nausea at 1345, order for zofran 4mg  ivp from Dr Hilarie Fredrickson, Zofran 4mg  IVP given at 1350

## 2018-07-10 NOTE — Progress Notes (Signed)
To PACU, VSS. Report to [email or web address] 

## 2018-07-14 ENCOUNTER — Telehealth: Payer: Self-pay | Admitting: *Deleted

## 2018-07-14 NOTE — Telephone Encounter (Signed)
  Follow up Call-  Call back number 07/10/2018  Post procedure Call Back phone  # [phone]  Permission to leave phone message Yes  Some recent data might be hidden     Patient questions:  Do you have a fever, pain , or abdominal swelling? No. Pain Score  0 *  Have you tolerated food without any problems? Yes.    Have you been able to return to your normal activities? Yes.    Do you have any questions about your discharge instructions: Diet   No. Medications  No. Follow up visit  No.  Do you have questions or concerns about your Care? No.  Actions: * If pain score is 4 or above: No action needed, pain <4.  1. Have you developed a fever since your procedure? NO  2.   Have you had an respiratory symptoms (SOB or cough) since your procedure? NO  3.   Have you tested positive for COVID 19 since your procedure NO  4.   Have you had any family members/close contacts diagnosed with the COVID 19 since your procedure?  NO   If yes to any of these questions please route to Joylene John, RN and Alphonsa Gin, RN.

## 2018-07-14 NOTE — Telephone Encounter (Signed)
Follow up call, left message for pt to call if any problems or fever, or any covid 19 exposure or sx.

## 2018-07-14 NOTE — Telephone Encounter (Signed)
Entered duplicate

## 2018-07-16 ENCOUNTER — Encounter: Payer: Self-pay | Admitting: Internal Medicine

## 2018-07-21 ENCOUNTER — Other Ambulatory Visit: Payer: Self-pay | Admitting: Family Medicine

## 2018-07-22 NOTE — Telephone Encounter (Signed)
NA / fast busy Not able to RF this medication, controlled substance, pt will have to be seen

## 2018-07-28 NOTE — Telephone Encounter (Signed)
Multiple attempts made to contact patient.  This encounter will now be closed  

## 2018-09-12 ENCOUNTER — Ambulatory Visit (INDEPENDENT_AMBULATORY_CARE_PROVIDER_SITE_OTHER): Payer: BC Managed Care – PPO | Admitting: Family Medicine

## 2018-09-12 ENCOUNTER — Encounter: Payer: Self-pay | Admitting: Family Medicine

## 2018-09-12 VITALS — Wt 275.0 lb

## 2018-09-12 DIAGNOSIS — G4700 Insomnia, unspecified: Secondary | ICD-10-CM

## 2018-09-12 DIAGNOSIS — G4709 Other insomnia: Secondary | ICD-10-CM | POA: Diagnosis not present

## 2018-09-12 DIAGNOSIS — Z6841 Body Mass Index (BMI) 40.0 and over, adult: Secondary | ICD-10-CM

## 2018-09-12 MED ORDER — ZOLPIDEM TARTRATE ER 6.25 MG PO TBCR: 6.2500 mg | EXTENDED_RELEASE_TABLET | Freq: Every evening | ORAL | 5 refills | Status: DC | PRN

## 2018-09-12 NOTE — Progress Notes (Signed)
Virtual Visit via telephone Note  I connected with Ruth Rivers on 09/12/18 at 22 by telephone and verified that I am speaking with the correct person using two identifiers. Ruth Rivers is currently located at home and no other people are currently with her during visit. The provider, Fransisca Kaufmann Tayshaun Kroh, MD is located in their office at time of visit.  Call ended at 1334  I discussed the limitations, risks, security and privacy concerns of performing an evaluation and management service by telephone and the availability of in person appointments. I also discussed with the patient that there may be a patient responsible charge related to this service. The patient expressed understanding and agreed to proceed.   History and Present Illness: Patient is calling in for recheck on insomnia and she is not staying asleep.  She says her mind just wakes up.  She is been off of her Ambien that she had before she was trying to come off of that but is just not been going as well and she would like to get back on it to see if it can help.  Patient is under treatment for psychiatric illness with psychiatry and she is still under treatment for that  Patient is calling in today to discuss obesity.  She says she is gained weight with coronavirus and is just gotten the point where she cannot take it anymore.  She just really wants to do something extreme to help with this and she would like referral to bariatric clinic.  She is ready to make that changes that she needs to do to head towards surgery and would like to go see them.  No diagnosis found.  Outpatient Encounter Medications as of 09/12/2018  Medication Sig  . benzonatate (TESSALON) 200 MG capsule Take 1 capsule (200 mg total) by mouth 3 (three) times daily as needed. (Patient not taking: Reported on 07/01/2018)  . buPROPion (WELLBUTRIN XL) 300 MG 24 hr tablet daily.   . calcium-vitamin D (OSCAL WITH D) 500-200 MG-UNIT tablet Take 1 tablet by mouth  daily.  . clonazePAM (KLONOPIN) 1 MG tablet Take 0.5 mg by mouth as needed.   . dimenhyDRINATE (DRAMAMINE) 50 MG tablet Take 50 mg by mouth as needed.  Marland Kitchen HYDROcodone-homatropine (HYCODAN) 5-1.5 MG/5ML syrup Take 5 mLs by mouth every 6 (six) hours as needed for cough. (Patient not taking: Reported on 07/01/2018)  . promethazine (PHENERGAN) 25 MG tablet Take 1 tablet (25 mg total) by mouth every 8 (eight) hours as needed for nausea or vomiting. (Patient not taking: Reported on 07/01/2018)  . promethazine-dextromethorphan (PROMETHAZINE-DM) 6.25-15 MG/5ML syrup Take 2.5 mLs by mouth 4 (four) times daily as needed for cough. (Patient not taking: Reported on 07/01/2018)  . traZODone (DESYREL) 50 MG tablet Take 50 mg by mouth at bedtime. 2 -3 tablets at bedtime  . Vortioxetine HBr (BRINTELLIX) 10 MG TABS Take 5 mg by mouth 2 (two) times daily.   Marland Kitchen zolpidem (AMBIEN CR) 6.25 MG CR tablet TAKE 1 TO 2 TABLETS AT BEDTIME AS NEEEDED FOR SLEEP (Patient not taking: Reported on 07/01/2018)   No facility-administered encounter medications on file as of 09/12/2018.     Review of Systems  Constitutional: Negative for chills and fever.  Eyes: Negative for visual disturbance.  Respiratory: Negative for chest tightness and shortness of breath.   Cardiovascular: Negative for chest pain and leg swelling.  Musculoskeletal: Negative for back pain and gait problem.  Skin: Negative for rash.  Neurological: Negative for light-headedness and  headaches.  Psychiatric/Behavioral: Negative for agitation and behavioral problems.  All other systems reviewed and are negative.   Observations/Objective: Patient sounds comfortable and in no acute distress  Assessment and Plan: Problem List Items Addressed This Visit      Other   Obesity   Relevant Orders   Amb Referral to Bariatric Surgery   Insomnia - Primary   Relevant Medications   zolpidem (AMBIEN CR) 6.25 MG CR tablet       Follow Up Instructions:  Follow-up in 6  months   I discussed the assessment and treatment plan with the patient. The patient was provided an opportunity to ask questions and all were answered. The patient agreed with the plan and demonstrated an understanding of the instructions.   The patient was advised to call back or seek an in-person evaluation if the symptoms worsen or if the condition fails to improve as anticipated.  The above assessment and management plan was discussed with the patient. The patient verbalized understanding of and has agreed to the management plan. Patient is aware to call the clinic if symptoms persist or worsen. Patient is aware when to return to the clinic for a follow-up visit. Patient educated on when it is appropriate to go to the emergency department.    I provided 14 minutes of non-face-to-face time during this encounter.    Worthy Rancher, MD

## 2018-09-26 ENCOUNTER — Other Ambulatory Visit: Payer: Self-pay

## 2018-09-26 ENCOUNTER — Ambulatory Visit (INDEPENDENT_AMBULATORY_CARE_PROVIDER_SITE_OTHER): Payer: BC Managed Care – PPO | Admitting: Physician Assistant

## 2018-09-26 ENCOUNTER — Encounter: Payer: Self-pay | Admitting: Physician Assistant

## 2018-09-26 DIAGNOSIS — J302 Other seasonal allergic rhinitis: Secondary | ICD-10-CM

## 2018-09-26 DIAGNOSIS — J019 Acute sinusitis, unspecified: Secondary | ICD-10-CM

## 2018-09-26 NOTE — Progress Notes (Signed)
    Telephone visit  Subjective: [id number], sore throat, allergy PCP: Dettinger, Fransisca Kaufmann, MD [id number] Thresher is a 43 y.o. female calls for telephone consult today. Patient provides verbal consent for consult held via phone.  Patient is identified with 2 separate identifiers.  At this time the entire area is on COVID-19 social distancing and stay home orders are in place.  Patient is of higher risk and therefore we are performing this by a virtual method.  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  The patient states that over the past week she has had continued allergy drainage.  She gets this usually as in the spring and the fall.  She will get a sinus infection.  So she has had some head pressure, and postnasal drainage and this is creating her sore throat.  She denies any fever or chills.  She has no known exposure to COVID-19.  I have encouraged the patient to continue with an antihistamine such as Claritin or Zyrtec for daytime and over-the-counter medicines for the sore throat and congestion.   ROS: Per HPI  Allergies  Allergen Reactions  . Cephalosporins Itching and Rash  . Penicillins Rash   Past Medical History:  Diagnosis Date  . Adenomatous colon polyp   . Allergy    seasonal  . Anxiety   . Cellulitis   . Cough    had a cough for 2 weeks/ in isolation for over 2 weeks/ no fever/ in March of 2020  . Depression   . Gastritis   . History of herniated intervertebral disc   . Insomnia   . Obesity   . Post-operative nausea and vomiting     Current Outpatient Medications:  .  buPROPion (WELLBUTRIN XL) 300 MG 24 hr tablet, daily. , Disp: , Rfl:  .  calcium-vitamin D (OSCAL WITH D) 500-200 MG-UNIT tablet, Take 1 tablet by mouth daily., Disp: , Rfl:  .   clonazePAM (KLONOPIN) 1 MG tablet, Take 0.5 mg by mouth as needed. , Disp: , Rfl:  .  dimenhyDRINATE (DRAMAMINE) 50 MG tablet, Take 50 mg by mouth as needed., Disp: , Rfl:  .  traZODone (DESYREL) 50 MG tablet, Take 50 mg by mouth at bedtime. 2 -3 tablets at bedtime, Disp: , Rfl:  .  Vortioxetine HBr (BRINTELLIX) 10 MG TABS, Take 5 mg by mouth 2 (two) times daily. , Disp: , Rfl:  .  zolpidem (AMBIEN CR) 6.25 MG CR tablet, Take 1-2 tablets (6.25-12.5 mg total) by mouth at bedtime as needed for sleep., Disp: 60 tablet, Rfl: 5  Assessment/ Plan: 43 y.o. female   1. Acute sinusitis, recurrence not specified, unspecified location Fluids, rest, call if anything worsens Claritin or some over-the-counter antihistamine  2. Seasonal allergic rhinitis, unspecified trigger Claritin or some over-the-counter antihistamine   No follow-ups on file.  Continue all other maintenance medications as listed above.  Start time: 2:56 PM End time: 3:04 PM  No orders of the defined types were placed in this encounter.   Particia Nearing PA-C [place] [phone]

## 2019-03-11 ENCOUNTER — Encounter: Payer: Self-pay | Admitting: Family Medicine

## 2019-03-11 ENCOUNTER — Ambulatory Visit (INDEPENDENT_AMBULATORY_CARE_PROVIDER_SITE_OTHER): Payer: BC Managed Care – PPO | Admitting: Family Medicine

## 2019-03-11 DIAGNOSIS — R519 Headache, unspecified: Secondary | ICD-10-CM

## 2019-03-11 MED ORDER — KETOROLAC TROMETHAMINE 10 MG PO TABS: 10.0000 mg | ORAL_TABLET | Freq: Four times a day (QID) | ORAL | 0 refills | Status: DC | PRN

## 2019-03-11 MED ORDER — ONDANSETRON 4 MG PO TBDP: 4.0000 mg | ORAL_TABLET | Freq: Three times a day (TID) | ORAL | 0 refills | Status: DC | PRN

## 2019-03-11 NOTE — Progress Notes (Signed)
Virtual Visit via Telephone Note  I connected with Ruth Rivers on 03/11/19 at 9:07 AM by telephone and verified that I am speaking with the correct person using two identifiers. Ruth Rivers is currently located at work and nobody is currently with her during this visit. The provider, Loman Brooklyn, FNP is located in their home at time of visit.  I discussed the limitations, risks, security and privacy concerns of performing an evaluation and management service by telephone and the availability of in person appointments. I also discussed with the patient that there may be a patient responsible charge related to this service. The patient expressed understanding and agreed to proceed.  Subjective: PCP: Dettinger, Fransisca Kaufmann, MD  Chief Complaint  Patient presents with  . Headache   Patient reports an acute headache that has been ongoing for the past 3 days.  It is associated with nausea and vomiting.  She has tried taking ibuprofen and Excedrin Migraine, which are not very effective.  She reports she did have a similar type headache a long time ago at which time she had to come into the office for an injection to get it to go away.   ROS: Per HPI  Current Outpatient Medications:  .  buPROPion (WELLBUTRIN XL) 300 MG 24 hr tablet, daily. , Disp: , Rfl:  .  calcium-vitamin D (OSCAL WITH D) 500-200 MG-UNIT tablet, Take 1 tablet by mouth daily., Disp: , Rfl:  .  clonazePAM (KLONOPIN) 1 MG tablet, Take 0.5 mg by mouth as needed. , Disp: , Rfl:  .  dimenhyDRINATE (DRAMAMINE) 50 MG tablet, Take 50 mg by mouth as needed., Disp: , Rfl:  .  traZODone (DESYREL) 50 MG tablet, Take 50 mg by mouth at bedtime. 2 -3 tablets at bedtime, Disp: , Rfl:  .  Vortioxetine HBr (BRINTELLIX) 10 MG TABS, Take 5 mg by mouth 2 (two) times daily. , Disp: , Rfl:  .  zolpidem (AMBIEN CR) 6.25 MG CR tablet, Take 1-2 tablets (6.25-12.5 mg total) by mouth at bedtime as needed for sleep., Disp: 60 tablet, Rfl: 5  Allergies    Allergen Reactions  . Cephalosporins Itching and Rash  . Penicillins Rash   Past Medical History:  Diagnosis Date  . Adenomatous colon polyp   . Allergy    seasonal  . Anxiety   . Cellulitis   . Cough    had a cough for 2 weeks/ in isolation for over 2 weeks/ no fever/ in March of 2020  . Depression   . Gastritis   . History of herniated intervertebral disc   . Insomnia   . Obesity   . Post-operative nausea and vomiting     Observations/Objective: A&O  No respiratory distress or wheezing audible over the phone Mood, judgement, and thought processes all WNL  Assessment and Plan: 1. Acute intractable headache, unspecified headache type - Unable to bring patient into the office for a Toradol injection due to her symptom of headache in the current COVID-19 pandemic.  I am sending Toradol for her to try orally at home.  Advised she may take 20 mg for the first dose and then 10 mg every 4-6 hours as needed.  Zofran for nausea and vomiting. - ketorolac (TORADOL) 10 MG tablet; Take 1 tablet (10 mg total) by mouth every 6 (six) hours as needed. May take 20 mg for the 1st dose.  Dispense: 30 tablet; Refill: 0 - ondansetron (ZOFRAN ODT) 4 MG disintegrating tablet; Take 1 tablet (4  mg total) by mouth every 8 (eight) hours as needed for nausea or vomiting.  Dispense: 30 tablet; Refill: 0   Follow Up Instructions:  I discussed the assessment and treatment plan with the patient. The patient was provided an opportunity to ask questions and all were answered. The patient agreed with the plan and demonstrated an understanding of the instructions.   The patient was advised to call back or seek an in-person evaluation if the symptoms worsen or if the condition fails to improve as anticipated.  The above assessment and management plan was discussed with the patient. The patient verbalized understanding of and has agreed to the management plan. Patient is aware to call the clinic if symptoms  persist or worsen. Patient is aware when to return to the clinic for a follow-up visit. Patient educated on when it is appropriate to go to the emergency department.   Time call ended: 9:18 AM  I provided 13 minutes of non-face-to-face time during this encounter.  Hendricks Limes, MSN, APRN, FNP-C [facility] 03/11/19

## 2019-03-15 ENCOUNTER — Ambulatory Visit: Payer: BC Managed Care – PPO | Attending: Internal Medicine

## 2019-03-15 DIAGNOSIS — Z23 Encounter for immunization: Secondary | ICD-10-CM | POA: Insufficient documentation

## 2019-03-15 NOTE — Progress Notes (Signed)
   Covid-19 Vaccination Clinic  Name:  Ruth Rivers    MRN: [id number] DOB: 09-24-1975  03/15/2019  [name] was observed post Covid-19 immunization for 15 minutes without incidence. She was provided with Vaccine Information Sheet and instruction to access the V-Safe system.   [name] was instructed to call 911 with any severe reactions post vaccine: Marland Kitchen Difficulty breathing  . Swelling of your face and throat  . A fast heartbeat  . A bad rash all over your body  . Dizziness and weakness    Immunizations Administered    Name Date Dose VIS Date Route   Moderna COVID-19 Vaccine 03/15/2019 12:58 PM 0.5 mL 12/16/2018 Intramuscular   Manufacturer: Moderna   Lot: [id number]   [id number]

## 2019-03-19 ENCOUNTER — Other Ambulatory Visit: Payer: Self-pay | Admitting: Family Medicine

## 2019-03-19 DIAGNOSIS — R519 Headache, unspecified: Secondary | ICD-10-CM

## 2019-03-19 NOTE — Telephone Encounter (Signed)
Last office visit 03/11/2019 Last refill 03/11/2019, #30, no refills

## 2019-04-03 ENCOUNTER — Other Ambulatory Visit: Payer: Self-pay | Admitting: Family Medicine

## 2019-04-03 DIAGNOSIS — G4700 Insomnia, unspecified: Secondary | ICD-10-CM

## 2019-04-06 ENCOUNTER — Telehealth (INDEPENDENT_AMBULATORY_CARE_PROVIDER_SITE_OTHER): Payer: BC Managed Care – PPO | Admitting: Family Medicine

## 2019-04-06 ENCOUNTER — Encounter: Payer: Self-pay | Admitting: Family Medicine

## 2019-04-06 DIAGNOSIS — M792 Neuralgia and neuritis, unspecified: Secondary | ICD-10-CM | POA: Diagnosis not present

## 2019-04-06 MED ORDER — PREDNISONE 10 MG PO TABS: ORAL_TABLET | ORAL | 0 refills | Status: DC

## 2019-04-06 MED ORDER — CYCLOBENZAPRINE HCL 10 MG PO TABS: 10.0000 mg | ORAL_TABLET | Freq: Three times a day (TID) | ORAL | 1 refills | Status: DC | PRN

## 2019-04-06 NOTE — Progress Notes (Signed)
Subjective:    Patient ID: Ruth Rivers, female    DOB: Feb 05, 1975, 44 y.o.   MRN: [id number]   HPI: Ruth Rivers is a 44 y.o. female presenting for pain  Goes from neck down the right arm to the pinky and the 4th finger as well on the right. Occurring several times daily from 10 seconds to 3-4 minutes eaach. No factors that exacerbate or relieve. Neck muscles feel tight. Onset 2 days ago.    Depression screen [facility] 2/9 09/06/2017 06/19/2017 02/15/2017 12/10/2016 11/15/2016  Decreased Interest 0 0 0 0 0  Down, Depressed, Hopeless 0 0 0 1 1  PHQ - 2 Score 0 0 0 1 1  Altered sleeping - - - - -  Tired, decreased energy - - - - -  Change in appetite - - - - -  Feeling bad or failure about yourself  - - - - -  Trouble concentrating - - - - -  Moving slowly or fidgety/restless - - - - -  Suicidal thoughts - - - - -  PHQ-9 Score - - - - -  Difficult doing work/chores - - - - -     Relevant past medical, surgical, family and social history reviewed and updated as indicated.  Interim medical history since our last visit reviewed. Allergies and medications reviewed and updated.  ROS:  Review of Systems  Constitutional: Negative.   HENT: Negative.   Eyes: Negative for visual disturbance.  Respiratory: Negative for shortness of breath.   Cardiovascular: Negative for chest pain.  Gastrointestinal: Negative for abdominal pain.  Musculoskeletal: Negative for arthralgias.  Neurological: Positive for numbness.     Social History   Tobacco Use  Smoking Status Never Smoker  Smokeless Tobacco Never Used       Objective:     Wt Readings from Last 3 Encounters:  09/12/18 275 lb (124.7 kg)  07/10/18 259 lb (117.5 kg)  07/01/18 260 lb (117.9 kg)     Exam deferred. Pt. [place] due to COVID 19. Phone visit performed.   Assessment & Plan:   1. Neuralgia of right upper extremity     Meds ordered this encounter  Medications  . predniSONE (DELTASONE) 10 MG tablet    Sig: Take 5  daily for 3 days followed by 4,3,2 and 1 for 3 days each.    Dispense:  45 tablet    Refill:  0  . cyclobenzaprine (FLEXERIL) 10 MG tablet    Sig: Take 1 tablet (10 mg total) by mouth 3 (three) times daily as needed for muscle spasms.    Dispense:  90 tablet    Refill:  1    No orders of the defined types were placed in this encounter.     Diagnoses and all orders for this visit:  Neuralgia of right upper extremity  Other orders -     predniSONE (DELTASONE) 10 MG tablet; Take 5 daily for 3 days followed by 4,3,2 and 1 for 3 days each. -     cyclobenzaprine (FLEXERIL) 10 MG tablet; Take 1 tablet (10 mg total) by mouth 3 (three) times daily as needed for muscle spasms.    Virtual Visit via telephone Note  I discussed the limitations, risks, security and privacy concerns of performing an evaluation and management service by telephone and the availability of in person appointments. The patient was identified with two identifiers. Pt.expressed understanding and agreed to proceed. Pt. Is at home. Dr. Livia Snellen is in his office.  Follow Up Instructions:   I discussed the assessment and treatment plan with the patient. The patient was provided an opportunity to ask questions and all were answered. The patient agreed with the plan and demonstrated an understanding of the instructions.   The patient was advised to call back or seek an in-person evaluation if the symptoms worsen or if the condition fails to improve as anticipated.   Total minutes including chart review and phone contact time: 13   Follow up plan: No follow-ups on file.  Claretta Fraise, MD [place]

## 2019-04-15 ENCOUNTER — Telehealth (INDEPENDENT_AMBULATORY_CARE_PROVIDER_SITE_OTHER): Payer: BC Managed Care – PPO | Admitting: Family Medicine

## 2019-04-15 ENCOUNTER — Encounter: Payer: Self-pay | Admitting: Family Medicine

## 2019-04-15 DIAGNOSIS — G4709 Other insomnia: Secondary | ICD-10-CM | POA: Diagnosis not present

## 2019-04-15 DIAGNOSIS — G4700 Insomnia, unspecified: Secondary | ICD-10-CM | POA: Diagnosis not present

## 2019-04-15 MED ORDER — ZOLPIDEM TARTRATE ER 6.25 MG PO TBCR: 6.2500 mg | EXTENDED_RELEASE_TABLET | Freq: Every evening | ORAL | 5 refills | Status: DC | PRN

## 2019-04-15 NOTE — Progress Notes (Signed)
Virtual Visit via telephone Note  I connected with Ruth Rivers on 04/15/19 at 1001 by telephone and verified that I am speaking with the correct person using two identifiers. Ruth Rivers is currently located at home and no other people are currently with her during visit. The provider, Fransisca Kaufmann Latica Hohmann, MD is located in their office at time of visit.  Call ended at 1012  I discussed the limitations, risks, security and privacy concerns of performing an evaluation and management service by telephone and the availability of in person appointments. I also discussed with the patient that there may be a patient responsible charge related to this service. The patient expressed understanding and agreed to proceed.   History and Present Illness: Insomnia Patient takes Lorrin Mais and is staying sleep and she uses it on some nights. When she does use it she gets better quality sleep Current rx- ambien 6.25, 1-2 tablets as needed # meds rx- 60 Effectiveness of current meds-works well Adverse reactions form meds-none  Pill count performed-No Last drug screen - n/a ( high risk q53m, moderate risk q37m, low risk yearly ) Urine drug screen today- No Was the [place] reviewed- yes  If yes were their any concerning findings? - none  No flowsheet data found.   Controlled substance contract signed on: n/a   No diagnosis found.  Outpatient Encounter Medications as of 04/15/2019  Medication Sig  . buPROPion (WELLBUTRIN XL) 300 MG 24 hr tablet daily.   . calcium-vitamin D (OSCAL WITH D) 500-200 MG-UNIT tablet Take 1 tablet by mouth daily.  . clonazePAM (KLONOPIN) 1 MG tablet Take 0.5 mg by mouth as needed.   . cyclobenzaprine (FLEXERIL) 10 MG tablet Take 1 tablet (10 mg total) by mouth 3 (three) times daily as needed for muscle spasms.  Marland Kitchen dimenhyDRINATE (DRAMAMINE) 50 MG tablet Take 50 mg by mouth as needed.  Marland Kitchen ketorolac (TORADOL) 10 MG tablet Take 1 tablet (10 mg total) by mouth every 6 (six) hours as  needed. May take 20 mg for the 1st dose.  . ondansetron (ZOFRAN-ODT) 4 MG disintegrating tablet TAKE 1 TABLET BY MOUTH EVERY 8 HOURS AS NEEDED FOR NAUSEA AND VOMITING  . predniSONE (DELTASONE) 10 MG tablet Take 5 daily for 3 days followed by 4,3,2 and 1 for 3 days each.  . traZODone (DESYREL) 50 MG tablet Take 50 mg by mouth at bedtime. 2 -3 tablets at bedtime  . Vortioxetine HBr (BRINTELLIX) 10 MG TABS Take 5 mg by mouth 2 (two) times daily.   Marland Kitchen zolpidem (AMBIEN CR) 6.25 MG CR tablet Take 1-2 tablets (6.25-12.5 mg total) by mouth at bedtime as needed for sleep.   No facility-administered encounter medications on file as of 04/15/2019.    Review of Systems  Constitutional: Negative for chills and fever.  Eyes: Negative for visual disturbance.  Respiratory: Negative for chest tightness and shortness of breath.   Cardiovascular: Negative for chest pain and leg swelling.  Musculoskeletal: Negative for back pain and gait problem.  Skin: Negative for rash.  Neurological: Negative for light-headedness and headaches.  Psychiatric/Behavioral: Positive for sleep disturbance. Negative for agitation, behavioral problems and dysphoric mood. The patient is not nervous/anxious.   All other systems reviewed and are negative.   Observations/Objective: Patient sounds comfortable and in no acute distress  Assessment and Plan: Problem List Items Addressed This Visit      Other   Insomnia - Primary   Relevant Medications   zolpidem (AMBIEN CR) 6.25 MG CR tablet  Patient was unable to get on video but was able to do it via phone, refilled Ambien, will be drug screen and contract at a future visit Follow up plan: Return in about 3 months (around 07/15/2019), or if symptoms worsen or fail to improve, for Physical exam.     I discussed the assessment and treatment plan with the patient. The patient was provided an opportunity to ask questions and all were answered. The patient agreed with the plan  and demonstrated an understanding of the instructions.   The patient was advised to call back or seek an in-person evaluation if the symptoms worsen or if the condition fails to improve as anticipated.  The above assessment and management plan was discussed with the patient. The patient verbalized understanding of and has agreed to the management plan. Patient is aware to call the clinic if symptoms persist or worsen. Patient is aware when to return to the clinic for a follow-up visit. Patient educated on when it is appropriate to go to the emergency department.    I provided 10 minutes of non-face-to-face time during this encounter.    Worthy Rancher, MD

## 2019-04-18 ENCOUNTER — Ambulatory Visit: Payer: BC Managed Care – PPO | Attending: Internal Medicine

## 2019-04-18 DIAGNOSIS — Z23 Encounter for immunization: Secondary | ICD-10-CM

## 2019-04-18 NOTE — Progress Notes (Signed)
   Covid-19 Vaccination Clinic  Name:  Ruth Rivers    MRN: [id number] DOB: 1975/10/13  04/18/2019  Ruth Rivers was observed post Covid-19 immunization for 15 minutes without incident. She was provided with Vaccine Information Sheet and instruction to access the V-Safe system.   Ruth Rivers was instructed to call 911 with any severe reactions post vaccine: Marland Kitchen Difficulty breathing  . Swelling of face and throat  . A fast heartbeat  . A bad rash all over body  . Dizziness and weakness   Immunizations Administered    Name Date Dose VIS Date Route   Moderna COVID-19 Vaccine 04/18/2019 12:44 PM 0.5 mL 12/16/2018 Intramuscular   Manufacturer: Moderna   Lot: [id number]   [id number]

## 2019-04-25 ENCOUNTER — Other Ambulatory Visit: Payer: Self-pay | Admitting: Family Medicine

## 2019-05-01 ENCOUNTER — Telehealth: Payer: BC Managed Care – PPO | Admitting: Family Medicine

## 2019-05-05 IMAGING — XA Imaging study
3 series · 3 of 3 positions shown · non-contrast
Comparison: none

CLINICAL DATA: Lumbosacral spondylosis without myelopathy. LEFT leg
radicular symptoms. Disc herniation L3-L4.

[Series 1: ortho adipose · 1 of 1 slices shown (1 of 3)]
[im 1/1]
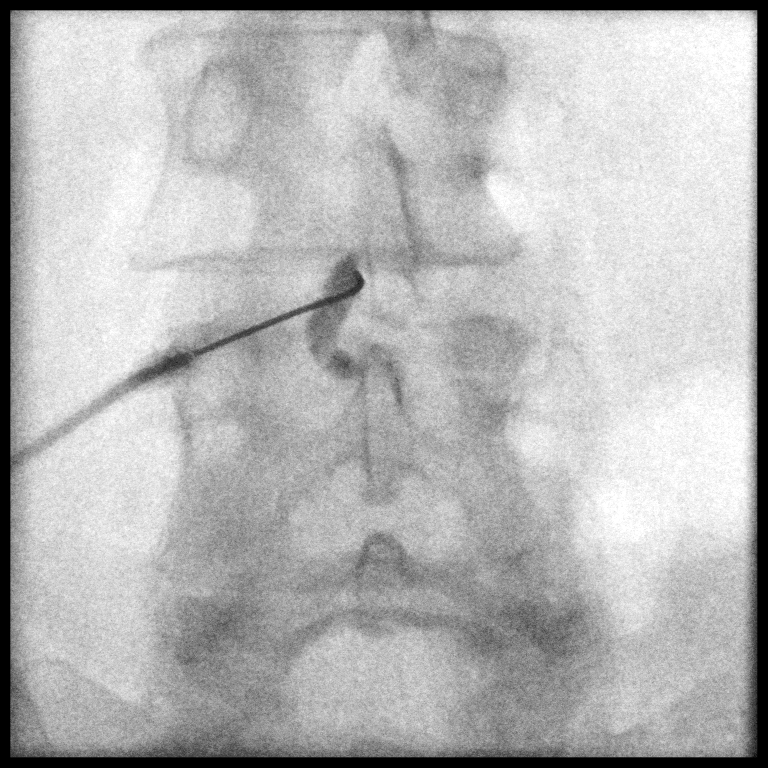

[Series 2: ortho adipose · 1 of 1 slices shown (2 of 3)]
[im 1/1]
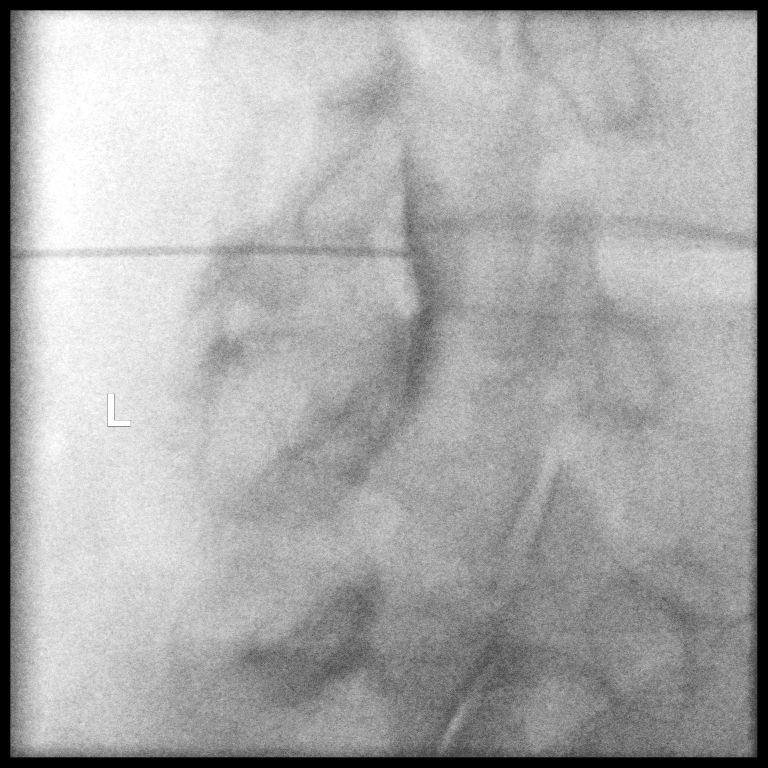

[Series 3: ortho adipose · 1 of 1 slices shown (3 of 3)]
[im 1/1]
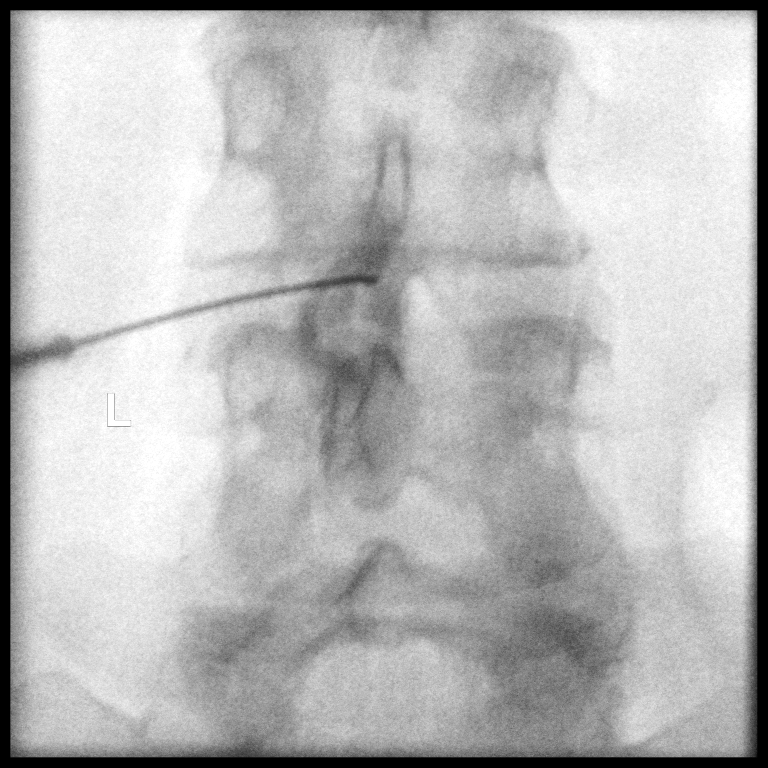

[3 of 3 positions shown; findings below may reference images not displayed]

FLUOROSCOPY TIME:  Dictate in minutes and seconds

PROCEDURE:
The procedure, risks, benefits, and alternatives were explained to
the patient. Questions regarding the procedure were encouraged and
answered. The patient understands and consents to the procedure.

LUMBAR EPIDURAL INJECTION:

An interlaminar approach was performed on LEFT at L3-L4. The
overlying skin was cleansed and anesthetized. A 6 inch, 20 gauge
epidural needle was advanced using loss-of-resistance technique.

DIAGNOSTIC EPIDURAL INJECTION:

Injection of Isovue-M 200 shows a good epidural pattern with spread
above and below the level of needle placement, primarily on the
LEFT; no vascular opacification is seen.

THERAPEUTIC EPIDURAL INJECTION:

120.0 mg of Depo-Medrol mixed with 2 mL 1% lidocaine were instilled.
The procedure was well-tolerated, and the patient was discharged
thirty minutes following the injection in good condition.

COMPLICATIONS:
None.
IMPRESSION: Technically successful epidural injection on the LEFT L3-L4 # 1.

## 2019-07-13 ENCOUNTER — Other Ambulatory Visit: Payer: Self-pay | Admitting: Family Medicine

## 2019-07-14 NOTE — Telephone Encounter (Signed)
Last rx'd 03/2019 Last OV 04/06/19

## 2019-09-23 ENCOUNTER — Other Ambulatory Visit (HOSPITAL_COMMUNITY): Payer: Self-pay | Admitting: Surgery

## 2019-09-23 ENCOUNTER — Other Ambulatory Visit: Payer: Self-pay | Admitting: Surgery

## 2019-10-08 ENCOUNTER — Other Ambulatory Visit: Payer: Self-pay

## 2019-10-08 ENCOUNTER — Other Ambulatory Visit (HOSPITAL_COMMUNITY): Payer: Self-pay | Admitting: Surgery

## 2019-10-08 ENCOUNTER — Ambulatory Visit (HOSPITAL_COMMUNITY)
Admission: RE | Admit: 2019-10-08 | Discharge: 2019-10-08 | Disposition: A | Discharge status: Home / Self Care | Payer: BC Managed Care – PPO | Source: Ambulatory Visit | Attending: Surgery | Admitting: Surgery

## 2019-10-15 ENCOUNTER — Other Ambulatory Visit: Payer: Self-pay | Admitting: Family Medicine

## 2019-10-15 DIAGNOSIS — G4700 Insomnia, unspecified: Secondary | ICD-10-CM

## 2019-10-15 DIAGNOSIS — G4709 Other insomnia: Secondary | ICD-10-CM

## 2019-11-12 ENCOUNTER — Other Ambulatory Visit: Payer: Self-pay

## 2019-11-12 ENCOUNTER — Encounter: Payer: Self-pay | Admitting: Family Medicine

## 2019-11-12 ENCOUNTER — Ambulatory Visit: Payer: BC Managed Care – PPO | Admitting: Family Medicine

## 2019-11-12 VITALS — BP 121/80 | HR 78 | Temp 97.7°F | Ht 62.0 in | Wt 276.0 lb

## 2019-11-12 DIAGNOSIS — G4709 Other insomnia: Secondary | ICD-10-CM

## 2019-11-12 DIAGNOSIS — G4700 Insomnia, unspecified: Secondary | ICD-10-CM | POA: Diagnosis not present

## 2019-11-12 DIAGNOSIS — Z13220 Encounter for screening for lipoid disorders: Secondary | ICD-10-CM | POA: Diagnosis not present

## 2019-11-12 MED ORDER — ZOLPIDEM TARTRATE ER 6.25 MG PO TBCR
6.2500 mg | EXTENDED_RELEASE_TABLET | Freq: Every evening | ORAL | 5 refills | Status: DC | PRN
Start: 2019-11-12 — End: 2020-05-12

## 2019-11-12 NOTE — Progress Notes (Signed)
BP 121/80   Pulse 78   Temp 97.7 F (36.5 C) (Temporal)   Ht '5\' 2"'  (1.575 m)   Wt 276 lb (125.2 kg)   SpO2 100%   BMI 50.48 kg/m    Subjective:   Patient ID: Ruth Rivers, female    DOB: 21-Jun-1975, 44 y.o.   MRN: [id number]  HPI: Ruth Rivers is a 44 y.o. female presenting on 11/12/2019 for Insomnia   HPI Insomnia Current rx-Ambien 6.25 mg, 1 to 2 tablets nightly as needed # meds rx-60 Effectiveness of current meds-works well Adverse reactions form meds-none  Pill count performed-No Last drug screen -N/A ( high risk q101m moderate risk q619mlow risk yearly ) Urine drug screen today- Yes [place]  If yes were their any concerning findings? -None  No flowsheet data found.   Controlled substance contract signed on: Today  Relevant past medical, surgical, family and social history reviewed and updated as indicated. Interim medical history since our last visit reviewed. Allergies and medications reviewed and updated.  Review of Systems  Constitutional: Negative for chills and fever.  Eyes: Negative for visual disturbance.  Respiratory: Negative for chest tightness and shortness of breath.   Cardiovascular: Negative for chest pain and leg swelling.  Musculoskeletal: Negative for back pain and gait problem.  Skin: Negative for rash.  Neurological: Negative for light-headedness and headaches.  Psychiatric/Behavioral: Positive for sleep disturbance. Negative for agitation, behavioral problems, self-injury and suicidal ideas.  All other systems reviewed and are negative.   Per HPI unless specifically indicated above   Allergies as of 11/12/2019      Reactions   Cephalosporins Itching, Rash   Penicillins Rash      Medication List       Accurate as of November 12, 2019  4:06 PM. If you have any questions, ask your nurse or doctor.        Brintellix 10 MG Tabs tablet Generic drug: vortioxetine HBr Take 5 mg by mouth 2 (two) times daily.     buPROPion 300 MG 24 hr tablet Commonly known as: WELLBUTRIN XL daily.   calcium-vitamin D 500-200 MG-UNIT tablet Commonly known as: OSCAL WITH D Take 1 tablet by mouth daily.   clonazePAM 1 MG tablet Commonly known as: KLONOPIN Take 0.5 mg by mouth as needed.   cyclobenzaprine 10 MG tablet Commonly known as: FLEXERIL TAKE 1 TABLET BY MOUTH THREE TIMES A DAY AS NEEDED FOR MUSCLE SPASMS   dimenhyDRINATE 50 MG tablet Commonly known as: DRAMAMINE Take 50 mg by mouth as needed.   ketorolac 10 MG tablet Commonly known as: TORADOL Take 1 tablet (10 mg total) by mouth every 6 (six) hours as needed. May take 20 mg for the 1st dose.   ondansetron 4 MG disintegrating tablet Commonly known as: ZOFRAN-ODT TAKE 1 TABLET BY MOUTH EVERY 8 HOURS AS NEEDED FOR NAUSEA AND VOMITING   traZODone 50 MG tablet Commonly known as: DESYREL Take 50 mg by mouth at bedtime. 2 -3 tablets at bedtime   zolpidem 6.25 MG CR tablet Commonly known as: AMBIEN CR Take 1-2 tablets (6.25-12.5 mg total) by mouth at bedtime as needed for sleep.        Objective:   BP 121/80   Pulse 78   Temp 97.7 F (36.5 C) (Temporal)   Ht '5\' 2"'  (1.575 m)   Wt 276 lb (125.2 kg)   SpO2 100%   BMI 50.48 kg/m   Wt Readings from Last 3 Encounters:  11/12/19 276 lb (125.2 kg)  09/12/18 275 lb (124.7 kg)  07/10/18 259 lb (117.5 kg)    Physical Exam Vitals and nursing note reviewed.  Constitutional:      General: She is not in acute distress.    Appearance: She is well-developed. She is not diaphoretic.  Eyes:     Conjunctiva/sclera: Conjunctivae normal.  Skin:    General: Skin is warm and dry.     Findings: No rash.  Neurological:     Mental Status: She is alert and oriented to person, place, and time.     Coordination: Coordination normal.  Psychiatric:        Behavior: Behavior normal.       Assessment & Plan:   Problem List Items Addressed This Visit      Other   Insomnia - Primary   Relevant  Medications   zolpidem (AMBIEN CR) 6.25 MG CR tablet   Other Relevant Orders   CBC with Differential/Platelet   ToxASSURE Select 13 (MW), Urine   ToxASSURE Select 13 (MW), Urine    Other Visit Diagnoses    Lipid screening       Relevant Orders   CBC with Differential/Platelet   CMP14+EGFR   Lipid panel      Continue Ambien, discussed doing labs but she says she schedule letter labs with the gastric bypass placed in 2 weeks and she will send those copies to [place]. Follow up plan: Return in about 6 months (around 05/12/2020), or if symptoms worsen or fail to improve, for Ambien recheck.  Counseling provided for all of the vaccine components Orders Placed This Encounter  Procedures  . CBC with Differential/Platelet  . CMP14+EGFR  . Lipid panel  . ToxASSURE Select 13 (MW), Urine    [name], MD [facility] 11/12/2019, 4:06 PM

## 2019-11-16 LAB — TOXASSURE SELECT 13 (MW), URINE

## 2019-12-16 ENCOUNTER — Ambulatory Visit: Payer: BC Managed Care – PPO | Admitting: Psychology

## 2019-12-21 ENCOUNTER — Encounter: Payer: Self-pay | Admitting: Dietician

## 2019-12-21 ENCOUNTER — Encounter: Payer: BC Managed Care – PPO | Attending: Surgery | Admitting: Dietician

## 2019-12-21 ENCOUNTER — Other Ambulatory Visit: Payer: Self-pay

## 2019-12-21 DIAGNOSIS — E669 Obesity, unspecified: Secondary | ICD-10-CM | POA: Insufficient documentation

## 2019-12-21 NOTE — Progress Notes (Signed)
Nutrition Assessment for Bariatric Surgery Medical Nutrition Therapy   Patient was seen on 12/21/2019 for Pre-Operative Nutrition Assessment. Letter of approval faxed to [facility] bariatric surgery program coordinator on 12/21/2019.   Referral stated Supervised Weight Loss (SWL) visits needed: 0  Planned surgery: Sleeve Pt expectation of surgery: to lose weight, to relieve pain in her knees   NUTRITION ASSESSMENT   Anthropometrics  Start weight at [facility]: 268.6 lbs (date: 12/21/19) Height: 62 in BMI: 49.1 kg/m2     Lifestyle & Dietary Hx Patient lives with her husband and son, works as a Pharmacist, hospital. States she has done well with losing weight before through [facility], but states after moving to [place] she did not continue with the program. States that this combined with being at home during the pandemic caused a significant amount of weight gain. Typical meal pattern is 3 meals per day, does not snack. 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    Any previous deficiencies: vitamin D   Micronutrient Nutrition Focused Physical Exam: Hair: no issues observed Eyes: no issues observed Mouth: no issues observed Neck: no issues observed Nails: no issues observed Skin: no issues observed  24-Hr Dietary Recall First Meal: biscuit Snack: - Second Meal: school lunch Snack: - Third Meal: soup (or sandwich)  Snack: - Beverages: water, caffeine free diet pepsi    NUTRITION DIAGNOSIS  Overweight/obesity (B[place]-3.3) related to past poor dietary habits and physical inactivity as evidenced by patient w/ planned bariatric surgery following dietary guidelines for continued weight loss.    NUTRITION INTERVENTION  Nutrition counseling (C-1) and education (E-2) to facilitate bariatric surgery goals.  Pre-Op Goals Reviewed with the Patient . Track food and beverage intake (pen and paper, MyFitness Pal, Baritastic app, etc.) . Make healthy food choices while monitoring portion sizes . Consume  3 meals per day or try to eat every 3-5 hours . Avoid concentrated sugars and fried foods . Keep sugar & fat in the single digits per serving on food labels . Practice CHEWING your food (aim for applesauce consistency) . Practice not drinking 15 minutes before, during, and 30 minutes after each meal and snack . Avoid all carbonated beverages (ex: soda, sparkling beverages)  . Limit caffeinated beverages (ex: coffee, tea, energy drinks) . Avoid all sugar-sweetened beverages (ex: regular soda, sports drinks)  . Avoid alcohol  . Aim for 64-100 ounces of FLUID daily (with at least half of fluid intake being plain water)  . Aim for at least 60-80 grams of PROTEIN daily . Look for a liquid protein source that contains ?15 g protein and ?5 g carbohydrate (ex: shakes, drinks, shots) . Make a list of non-food related activities . Physical activity is an important part of a healthy lifestyle so keep it moving! The goal is to reach 150 minutes of exercise per week, including cardiovascular and weight baring activity.  Handouts Provided Include  . Bariatric Surgery handouts (Nutrition Visits, Pre-Op Goals, Protein Shakes, Vitamins & Minerals)  Learning Style & Readiness for Change Teaching method utilized: Visual & Auditory  Demonstrated degree of understanding via: Teach Back  Barriers to learning/adherence to lifestyle change: Emotional Eating   MONITORING & EVALUATION Dietary intake, weekly physical activity, body weight, and pre-op goals reached at next nutrition visit.   Next Steps Patient is to follow up at [facility] for Pre-Op Class (>2 weeks before surgery) for further nutrition education. From a nutritional standpoint, it appears patient is an appropriate candidate for surgery.

## 2019-12-29 ENCOUNTER — Ambulatory Visit: Payer: BC Managed Care – PPO | Admitting: Psychology

## 2020-01-20 ENCOUNTER — Ambulatory Visit (INDEPENDENT_AMBULATORY_CARE_PROVIDER_SITE_OTHER): Payer: BC Managed Care – PPO | Admitting: Psychology

## 2020-01-20 DIAGNOSIS — F509 Eating disorder, unspecified: Secondary | ICD-10-CM

## 2020-01-27 ENCOUNTER — Ambulatory Visit: Payer: BC Managed Care – PPO | Admitting: Psychology

## 2020-02-18 ENCOUNTER — Encounter: Payer: Self-pay | Admitting: Family Medicine

## 2020-02-18 ENCOUNTER — Ambulatory Visit: Payer: BC Managed Care – PPO | Admitting: Family Medicine

## 2020-02-18 ENCOUNTER — Other Ambulatory Visit: Payer: Self-pay

## 2020-02-18 VITALS — BP 114/73 | HR 73 | Temp 97.4°F | Ht 62.0 in | Wt 267.4 lb

## 2020-02-18 DIAGNOSIS — M25561 Pain in right knee: Secondary | ICD-10-CM

## 2020-02-18 DIAGNOSIS — Z23 Encounter for immunization: Secondary | ICD-10-CM

## 2020-02-18 MED ORDER — DICLOFENAC SODIUM 75 MG PO TBEC: 75.0000 mg | DELAYED_RELEASE_TABLET | Freq: Two times a day (BID) | ORAL | 1 refills | Status: DC

## 2020-02-18 MED ORDER — DICLOFENAC SODIUM 1 % EX GEL: 2.0000 g | Freq: Four times a day (QID) | CUTANEOUS | 2 refills | Status: DC

## 2020-02-18 NOTE — Patient Instructions (Signed)
Knee Exercises Ask your health care provider which exercises are safe for you. Do exercises exactly as told by your health care provider and adjust them as directed. It is normal to feel mild stretching, pulling, tightness, or discomfort as you do these exercises. Stop right away if you feel sudden pain or your pain gets worse. Do not begin these exercises until told by your health care provider. Stretching and range-of-motion exercises These exercises warm up your muscles and joints and improve the movement and flexibility of your knee. These exercises also help to relieve pain and swelling. Knee extension, prone 1. Lie on your abdomen (prone position) on a bed. 2. Place your left / right knee just beyond the edge of the surface so your knee is not on the bed. You can put a towel under your left / right thigh just above your kneecap for comfort. 3. Relax your leg muscles and allow gravity to straighten your knee (extension). You should feel a stretch behind your left / right knee. 4. Hold this position for __________ seconds. 5. Scoot up so your knee is supported between repetitions. Repeat __________ times. Complete this exercise __________ times a day. Knee flexion, active 1. Lie on your back with both legs straight. If this causes back discomfort, bend your left / right knee so your foot is flat on the floor. 2. Slowly slide your left / right heel back toward your buttocks. Stop when you feel a gentle stretch in the front of your knee or thigh (flexion). 3. Hold this position for __________ seconds. 4. Slowly slide your left / right heel back to the starting position. Repeat __________ times. Complete this exercise __________ times a day.   Quadriceps stretch, prone 1. Lie on your abdomen on a firm surface, such as a bed or padded floor. 2. Bend your left / right knee and hold your ankle. If you cannot reach your ankle or pant leg, loop a belt around your foot and grab the belt  instead. 3. Gently pull your heel toward your buttocks. Your knee should not slide out to the side. You should feel a stretch in the front of your thigh and knee (quadriceps). 4. Hold this position for __________ seconds. Repeat __________ times. Complete this exercise __________ times a day.   Hamstring, supine 1. Lie on your back (supine position). 2. Loop a belt or towel over the ball of your left / right foot. The ball of your foot is on the walking surface, right under your toes. 3. Straighten your left / right knee and slowly pull on the belt to raise your leg until you feel a gentle stretch behind your knee (hamstring). ? Do not let your knee bend while you do this. ? Keep your other leg flat on the floor. 4. Hold this position for __________ seconds. Repeat __________ times. Complete this exercise __________ times a day. Strengthening exercises These exercises build strength and endurance in your knee. Endurance is the ability to use your muscles for a long time, even after they get tired. Quadriceps, isometric This exercise stretches the muscles in front of your thigh (quadriceps) without moving your knee joint (isometric). 1. Lie on your back with your left / right leg extended and your other knee bent. Put a rolled towel or small pillow under your knee if told by your health care provider. 2. Slowly tense the muscles in the front of your left / right thigh. You should see your kneecap slide up toward your   hip or see increased dimpling just above the knee. This motion will push the back of the knee toward the floor. 3. For __________ seconds, hold the muscle as tight as you can without increasing your pain. 4. Relax the muscles slowly and completely. Repeat __________ times. Complete this exercise __________ times a day.   Straight leg raises This exercise stretches the muscles in front of your thigh (quadriceps) and the muscles that move your hips (hip flexors). 1. Lie on your back  with your left / right leg extended and your other knee bent. 2. Tense the muscles in the front of your left / right thigh. You should see your kneecap slide up or see increased dimpling just above the knee. Your thigh may even shake a bit. 3. Keep these muscles tight as you raise your leg 4-6 inches (10-15 cm) off the floor. Do not let your knee bend. 4. Hold this position for __________ seconds. 5. Keep these muscles tense as you lower your leg. 6. Relax your muscles slowly and completely after each repetition. Repeat __________ times. Complete this exercise __________ times a day. Hamstring, isometric 1. Lie on your back on a firm surface. 2. Bend your left / right knee about __________ degrees. 3. Dig your left / right heel into the surface as if you are trying to pull it toward your buttocks. Tighten the muscles in the back of your thighs (hamstring) to "dig" as hard as you can without increasing any pain. 4. Hold this position for __________ seconds. 5. Release the tension gradually and allow your muscles to relax completely for __________ seconds after each repetition. Repeat __________ times. Complete this exercise __________ times a day. Hamstring curls If told by your health care provider, do this exercise while wearing ankle weights. Begin with __________ lb weights. Then increase the weight by 1 lb (0.5 kg) increments. Do not wear ankle weights that are more than __________ lb. 1. Lie on your abdomen with your legs straight. 2. Bend your left / right knee as far as you can without feeling pain. Keep your hips flat against the floor. 3. Hold this position for __________ seconds. 4. Slowly lower your leg to the starting position. Repeat __________ times. Complete this exercise __________ times a day.   Squats This exercise strengthens the muscles in front of your thigh and knee (quadriceps). 1. Stand in front of a table, with your feet and knees pointing straight ahead. You may rest  your hands on the table for balance but not for support. 2. Slowly bend your knees and lower your hips like you are going to sit in a chair. ? Keep your weight over your heels, not over your toes. ? Keep your lower legs upright so they are parallel with the table legs. ? Do not let your hips go lower than your knees. ? Do not bend lower than told by your health care provider. ? If your knee pain increases, do not bend as low. 3. Hold the squat position for __________ seconds. 4. Slowly push with your legs to return to standing. Do not use your hands to pull yourself to standing. Repeat __________ times. Complete this exercise __________ times a day. Wall slides This exercise strengthens the muscles in front of your thigh and knee (quadriceps). 1. Lean your back against a smooth wall or door, and walk your feet out 18-24 inches (46-61 cm) from it. 2. Place your feet hip-width apart. 3. Slowly slide down the wall or door   until your knees bend __________ degrees. Keep your knees over your heels, not over your toes. Keep your knees in line with your hips. 4. Hold this position for __________ seconds. Repeat __________ times. Complete this exercise __________ times a day.   Straight leg raises This exercise strengthens the muscles that rotate the leg at the hip and move it away from your body (hip abductors). 1. Lie on your side with your left / right leg in the top position. Lie so your head, shoulder, knee, and hip line up. You may bend your bottom knee to help you keep your balance. 2. Roll your hips slightly forward so your hips are stacked directly over each other and your left / right knee is facing forward. 3. Leading with your heel, lift your top leg 4-6 inches (10-15 cm). You should feel the muscles in your outer hip lifting. ? Do not let your foot drift forward. ? Do not let your knee roll toward the ceiling. 4. Hold this position for __________ seconds. 5. Slowly return your leg to the  starting position. 6. Let your muscles relax completely after each repetition. Repeat __________ times. Complete this exercise __________ times a day.   Straight leg raises This exercise stretches the muscles that move your hips away from the front of the pelvis (hip extensors). 1. Lie on your abdomen on a firm surface. You can put a pillow under your hips if that is more comfortable. 2. Tense the muscles in your buttocks and lift your left / right leg about 4-6 inches (10-15 cm). Keep your knee straight as you lift your leg. 3. Hold this position for __________ seconds. 4. Slowly lower your leg to the starting position. 5. Let your leg relax completely after each repetition. Repeat __________ times. Complete this exercise __________ times a day. This information is not intended to replace advice given to you by your health care provider. Make sure you discuss any questions you have with your health care provider. Document Revised: 10/22/2017 Document Reviewed: 10/22/2017 Elsevier Patient Education  2021 Elsevier Inc.  

## 2020-02-18 NOTE — Progress Notes (Signed)
Assessment & Plan:  1. Acute pain of right knee - NSAID, Voltaren gel, and exercises.  Patient will come tomorrow afternoon for a knee x-ray.  We were unable to obtain today.  Knee exercises provided for the patient. - diclofenac (VOLTAREN) 75 MG EC tablet; Take 1 tablet (75 mg total) by mouth 2 (two) times daily.  Dispense: 60 tablet; Refill: 1 - diclofenac Sodium (VOLTAREN) 1 % GEL; Apply 2 g topically 4 (four) times daily.  Dispense: 350 g; Refill: 2 - DG Knee 1-2 Views Right; Future   Follow up plan: Return in about 6 weeks (around 03/31/2020) for knee pain.  Hendricks Limes, MSN, APRN, FNP-C [facility]  Subjective:   Patient ID: Ruth Rivers, female    DOB: Feb 23, 1975, 45 y.o.   MRN: [id number]  HPI: Ruth Rivers is a 46 y.o. female presenting on 02/18/2020 for Knee Pain (Right knee x 2 months/)  Knee Pain: Patient presents with knee pain involving the  right knee. Onset of the symptoms was 1-2 months. Inciting event: none known. Current symptoms include pain and stiffness.  She reports the pain started out mild but has been getting progressively worse over the past month.  She reports at its lowest it is a 3 out of 10; at its highest it is 5-6 out of 10; at bedtime it is definitely 6 out of 10.  Pain is aggravated by bending, straightening, standing, sitting.  Patient has had no prior knee problems. Evaluation to date: none. Treatment to date: OTC analgesics which are ineffective, prescription NSAIDS which are ineffective and rest.   ROS: Negative unless specifically indicated above in HPI.   Relevant past medical history reviewed and updated as indicated.   Allergies and medications reviewed and updated.   Current Outpatient Medications:  .  buPROPion (WELLBUTRIN XL) 300 MG 24 hr tablet, daily. , Disp: , Rfl:  .  calcium-vitamin D (OSCAL WITH D) 500-200 MG-UNIT tablet, Take 1 tablet by mouth daily., Disp: , Rfl:  .  clonazePAM (KLONOPIN) 1 MG tablet, Take  0.5 mg by mouth as needed. , Disp: , Rfl:  .  cyclobenzaprine (FLEXERIL) 10 MG tablet, TAKE 1 TABLET BY MOUTH THREE TIMES A DAY AS NEEDED FOR MUSCLE SPASMS, Disp: 90 tablet, Rfl: 1 .  dimenhyDRINATE (DRAMAMINE) 50 MG tablet, Take 50 mg by mouth as needed., Disp: , Rfl:  .  ketorolac (TORADOL) 10 MG tablet, Take 1 tablet (10 mg total) by mouth every 6 (six) hours as needed. May take 20 mg for the 1st dose., Disp: 30 tablet, Rfl: 0 .  ondansetron (ZOFRAN-ODT) 4 MG disintegrating tablet, TAKE 1 TABLET BY MOUTH EVERY 8 HOURS AS NEEDED FOR NAUSEA AND VOMITING, Disp: 18 tablet, Rfl: 1 .  traZODone (DESYREL) 50 MG tablet, Take 50 mg by mouth at bedtime. 2 -3 tablets at bedtime, Disp: , Rfl:  .  vortioxetine HBr (TRINTELLIX) 10 MG TABS tablet, Take 5 mg by mouth 2 (two) times daily. , Disp: , Rfl:  .  zolpidem (AMBIEN CR) 6.25 MG CR tablet, Take 1-2 tablets (6.25-12.5 mg total) by mouth at bedtime as needed for sleep., Disp: 60 tablet, Rfl: 5  Allergies  Allergen Reactions  . Cephalosporins Itching and Rash  . Penicillins Rash    Objective:   BP 114/73   Pulse 73   Temp (!) 97.4 F (36.3 C) (Temporal)   Ht 5\' 2"  (1.575 m)   Wt 267 lb 6.4 oz (121.3 kg)   SpO2 100%  BMI 48.91 kg/m    Physical Exam Vitals reviewed.  Constitutional:      General: She is not in acute distress.    Appearance: Normal appearance. She is not ill-appearing, toxic-appearing or diaphoretic.  HENT:     Head: Normocephalic and atraumatic.  Eyes:     General: No scleral icterus.       Right eye: No discharge.        Left eye: No discharge.     Conjunctiva/sclera: Conjunctivae normal.  Cardiovascular:     Rate and Rhythm: Normal rate.  Pulmonary:     Effort: Pulmonary effort is normal. No respiratory distress.  Musculoskeletal:        General: Normal range of motion.     Cervical back: Normal range of motion.  Skin:    General: Skin is warm and dry.     Capillary Refill: Capillary refill takes less than 2  seconds.  Neurological:     General: No focal deficit present.     Mental Status: She is alert and oriented to person, place, and time. Mental status is at baseline.  Psychiatric:        Mood and Affect: Mood normal.        Behavior: Behavior normal.        Thought Content: Thought content normal.        Judgment: Judgment normal.

## 2020-02-19 ENCOUNTER — Other Ambulatory Visit: Payer: BC Managed Care – PPO

## 2020-02-19 ENCOUNTER — Ambulatory Visit (INDEPENDENT_AMBULATORY_CARE_PROVIDER_SITE_OTHER): Payer: BC Managed Care – PPO

## 2020-02-19 ENCOUNTER — Telehealth: Payer: Self-pay | Admitting: *Deleted

## 2020-02-19 DIAGNOSIS — M25561 Pain in right knee: Secondary | ICD-10-CM

## 2020-02-19 NOTE — Telephone Encounter (Signed)
Key: [id number] Diclofenac Sodium 1% gel sent to plan

## 2020-02-22 NOTE — Telephone Encounter (Signed)
She has to have DX of osteoarthritis in order for insurance to cover.

## 2020-02-22 NOTE — Telephone Encounter (Signed)
[facility]  received a request from your provider for coverage of Diclofenac Sodium Topical Gel 1%. The request was denied because: Coverage for this medication is denied for the following reason(s). We reviewed the information we received about your condition and circumstances. We used the plan approved policy when making this decision. The policy states the requested drug may be covered when it is used for osteoarthritis pain in joints susceptible to topical treatment, such as feet, ankles, knees, hands, wrists, or elbows. Based on the policy and the information we received, your request was denied. The request was denied because it is not being used for osteoarthritis pain in a joint susceptible to topical treatment.

## 2020-02-22 NOTE — Telephone Encounter (Signed)
Patient aware and verbalizes understanding. 

## 2020-02-22 NOTE — Telephone Encounter (Signed)
Oh, I understand. Can we please call patient and let her know the medication is OTC?

## 2020-02-22 NOTE — Telephone Encounter (Signed)
I don't understand. It is for a knee which is one of the joints listed.

## 2020-04-08 ENCOUNTER — Other Ambulatory Visit: Payer: Self-pay | Admitting: Family Medicine

## 2020-04-08 DIAGNOSIS — M25561 Pain in right knee: Secondary | ICD-10-CM

## 2020-04-14 ENCOUNTER — Ambulatory Visit: Payer: BC Managed Care – PPO | Admitting: Nurse Practitioner

## 2020-04-14 ENCOUNTER — Encounter: Payer: Self-pay | Admitting: Nurse Practitioner

## 2020-04-14 DIAGNOSIS — J069 Acute upper respiratory infection, unspecified: Secondary | ICD-10-CM

## 2020-04-14 MED ORDER — BENZONATATE 100 MG PO CAPS: 100.0000 mg | ORAL_CAPSULE | Freq: Three times a day (TID) | ORAL | 0 refills | Status: DC | PRN

## 2020-04-14 MED ORDER — AZITHROMYCIN 250 MG PO TABS: ORAL_TABLET | ORAL | 0 refills | Status: DC

## 2020-04-14 NOTE — Progress Notes (Signed)
Virtual Visit  Note Due to COVID-19 pandemic this visit was conducted virtually. This visit type was conducted due to national recommendations for restrictions regarding the COVID-19 Pandemic (e.g. social distancing, sheltering in place) in an effort to limit this patient's exposure and mitigate transmission in our community. All issues noted in this document were discussed and addressed.  A physical exam was not performed with this format.  I connected with Ruth Rivers on 04/14/20 at 11:51 by telephone and verified that I am speaking with the correct person using two identifiers. Ruth Rivers is currently located at school and no one is currently with  With her during visit. The provider, Mary-Margaret Hassell Done, FNP is located in their office at time of visit.  I discussed the limitations, risks, security and privacy concerns of performing an evaluation and management service by telephone and the availability of in person appointments. I also discussed with the patient that there may be a patient responsible charge related to this service. The patient expressed understanding and agreed to proceed.   History and Present Illness:   Chief Complaint: Sinusitis   HPI Patient says she has cough congestion and runny nose. Started 3 days ago and she feels reallt bad today. Cough is wet and deep. Robitussin helps some. 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 Home covid was negative.   Review of Systems  Constitutional: Positive for chills, fever (100.5) and malaise/fatigue.  HENT: Positive for congestion. Negative for sore throat.   Respiratory: Positive for cough and sputum production. Negative for shortness of breath.   Musculoskeletal: Positive for myalgias (slight).  Neurological: Negative for dizziness and headaches.  All other systems reviewed and are negative.    Observations/Objective: Alert and oriented- answers all questions appropriately No  distress Voice hoarse Cough deep and et sounding   Assessment and Plan: Ruth Rivers in today with chief complaint of Sinusitis   1. URI with cough and congestion 1. Take meds as prescribed 2. Use a cool mist humidifier especially during the winter months and when heat has been humid. 3. Use saline nose sprays frequently 4. Saline irrigations of the nose can be very helpful if done frequently.  * 4X daily for 1 week*  * Use of a nettie pot can be helpful with this. Follow directions with this* 5. Drink plenty of fluids 6. Keep thermostat turn down low 7.For any cough or congestion  Use plain Mucinex- regular strength or max strength is fine   * Children- consult with Pharmacist for dosing 8. For fever or aces or pains- take tylenol or ibuprofen appropriate for age and weight.  * for fevers greater than 101 orally you may alternate ibuprofen and tylenol every  3 hours.    - azithromycin (ZITHROMAX Z-PAK) 250 MG tablet; As directed  Dispense: 6 tablet; Refill: 0 - benzonatate (TESSALON PERLES) 100 MG capsule; Take 1 capsule (100 mg total) by mouth 3 (three) times daily as needed for cough.  Dispense: 20 capsule; Refill: 0     Follow Up Instructions: prn    I discussed the assessment and treatment plan with the patient. The patient was provided an opportunity to ask questions and all were answered. The patient agreed with the plan and demonstrated an understanding of the instructions.   The patient was advised to call back or seek an in-person evaluation if the symptoms worsen or if the condition fails to improve as anticipated.  The above assessment  and management plan was discussed with the patient. The patient verbalized understanding of and has agreed to the management plan. Patient is aware to call the clinic if symptoms persist or worsen. Patient is aware when to return to the clinic for a follow-up visit. Patient educated on when it is appropriate to go to the emergency  department.   Time call ended:  12:07  I provided 16 minutes of  non face-to-face time during this encounter.    Mary-Margaret Hassell Done, FNP

## 2020-04-14 NOTE — Addendum Note (Signed)
Addended by: Chevis Pretty on: 04/14/2020 01:08 PM   Modules accepted: Level of Service

## 2020-05-12 ENCOUNTER — Encounter: Payer: Self-pay | Admitting: Family Medicine

## 2020-05-12 ENCOUNTER — Other Ambulatory Visit: Payer: Self-pay

## 2020-05-12 ENCOUNTER — Ambulatory Visit: Payer: BC Managed Care – PPO | Admitting: Family Medicine

## 2020-05-12 VITALS — BP 111/75 | HR 82 | Ht 62.0 in | Wt 268.0 lb

## 2020-05-12 DIAGNOSIS — Z23 Encounter for immunization: Secondary | ICD-10-CM | POA: Diagnosis not present

## 2020-05-12 DIAGNOSIS — F419 Anxiety disorder, unspecified: Secondary | ICD-10-CM | POA: Diagnosis not present

## 2020-05-12 DIAGNOSIS — G4709 Other insomnia: Secondary | ICD-10-CM | POA: Diagnosis not present

## 2020-05-12 DIAGNOSIS — G4700 Insomnia, unspecified: Secondary | ICD-10-CM

## 2020-05-12 DIAGNOSIS — F32A Depression, unspecified: Secondary | ICD-10-CM

## 2020-05-12 MED ORDER — ZOLPIDEM TARTRATE ER 6.25 MG PO TBCR: 6.2500 mg | EXTENDED_RELEASE_TABLET | Freq: Every evening | ORAL | 5 refills | Status: DC | PRN

## 2020-05-12 NOTE — Addendum Note (Signed)
Addended by: Alphonzo Dublin on: 05/12/2020 04:31 PM   Modules accepted: Orders

## 2020-05-12 NOTE — Progress Notes (Signed)
BP 111/75   Pulse 82   Ht 5\' 2"  (1.575 m)   Wt 268 lb (121.6 kg)   LMP 06/16/2018 Comment: states no way pregnant  SpO2 96%   BMI 49.02 kg/m    Subjective:   Patient ID: Ruth Rivers, female    DOB: 1975-01-26, 45 y.o.   MRN: [id number]  HPI: Ruth Rivers is a 45 y.o. female presenting on 05/12/2020 for Medical Management of Chronic Issues and Insomnia   HPI Insomnia recheck Current rx-Ambien 6.25-12-1/2 mg nightly. # meds rx- 60 Effectiveness of current meds-works well Adverse reactions form meds-none currently  Pill count performed-No Last drug screen -11/18/2019 ( high risk q3m, moderate risk q39m, low risk yearly ) Urine drug screen today- No Was the [place] reviewed-yes  If yes were their any concerning findings? -None  No flowsheet data found.   Controlled substance contract signed on: 11/18/2019  Patient sees psychiatry for anxiety and depression and feels like things are going well.  She will continue with them.  Relevant past medical, surgical, family and social history reviewed and updated as indicated. Interim medical history since our last visit reviewed. Allergies and medications reviewed and updated.  Review of Systems  Constitutional: Negative for chills and fever.  Eyes: Negative for redness and visual disturbance.  Respiratory: Negative for chest tightness and shortness of breath.   Cardiovascular: Negative for chest pain and leg swelling.  Musculoskeletal: Negative for back pain and gait problem.  Skin: Negative for rash.  Neurological: Negative for light-headedness and headaches.  Psychiatric/Behavioral: Positive for dysphoric mood and sleep disturbance. Negative for agitation, behavioral problems, self-injury and suicidal ideas. The patient is nervous/anxious.   All other systems reviewed and are negative.   Per HPI unless specifically indicated above   Allergies as of 05/12/2020      Reactions   Cephalosporins Itching, Rash   Penicillins Rash       Medication List       Accurate as of May 12, 2020  3:51 PM. If you have any questions, ask your nurse or doctor.        STOP taking these medications   azithromycin 250 MG tablet Commonly known as: Zithromax Z-Pak Stopped by: Fransisca Kaufmann Cheskel Silverio, MD   benzonatate 100 MG capsule Commonly known as: Best boy Stopped by: Fransisca Kaufmann Dejaun Vidrio, MD     TAKE these medications   buPROPion 300 MG 24 hr tablet Commonly known as: WELLBUTRIN XL daily.   calcium-vitamin D 500-200 MG-UNIT tablet Commonly known as: OSCAL WITH D Take 1 tablet by mouth daily.   clonazePAM 1 MG tablet Commonly known as: KLONOPIN Take 0.5 mg by mouth as needed.   diclofenac 75 MG EC tablet Commonly known as: VOLTAREN TAKE 1 TABLET BY MOUTH TWICE A DAY   diclofenac Sodium 1 % Gel Commonly known as: Voltaren Apply 2 g topically 4 (four) times daily.   ondansetron 4 MG disintegrating tablet Commonly known as: ZOFRAN-ODT TAKE 1 TABLET BY MOUTH EVERY 8 HOURS AS NEEDED FOR NAUSEA AND VOMITING   traZODone 50 MG tablet Commonly known as: DESYREL Take 50 mg by mouth at bedtime. 2 -3 tablets at bedtime   vortioxetine HBr 10 MG Tabs tablet Commonly known as: TRINTELLIX Take 5 mg by mouth 2 (two) times daily.   zolpidem 6.25 MG CR tablet Commonly known as: AMBIEN CR Take 1-2 tablets (6.25-12.5 mg total) by mouth at bedtime as needed for sleep.        Objective:  BP 111/75   Pulse 82   Ht 5\' 2"  (1.575 m)   Wt 268 lb (121.6 kg)   LMP 06/16/2018 Comment: states no way pregnant  SpO2 96%   BMI 49.02 kg/m   Wt Readings from Last 3 Encounters:  05/12/20 268 lb (121.6 kg)  02/18/20 267 lb 6.4 oz (121.3 kg)  12/21/19 268 lb 9.6 oz (121.8 kg)    Physical Exam Vitals and nursing note reviewed.  Constitutional:      General: She is not in acute distress.    Appearance: She is well-developed. She is not diaphoretic.  Eyes:     Conjunctiva/sclera: Conjunctivae normal.   Cardiovascular:     Rate and Rhythm: Normal rate and regular rhythm.     Heart sounds: Normal heart sounds. No murmur heard.   Pulmonary:     Effort: Pulmonary effort is normal. No respiratory distress.     Breath sounds: Normal breath sounds. No wheezing.  Musculoskeletal:        General: No tenderness. Normal range of motion.  Skin:    General: Skin is warm and dry.     Findings: No rash.  Neurological:     Mental Status: She is alert and oriented to person, place, and time.     Coordination: Coordination normal.  Psychiatric:        Behavior: Behavior normal.       Assessment & Plan:   Problem List Items Addressed This Visit      Other   Anxiety and depression - Primary   Insomnia   Relevant Medications   zolpidem (AMBIEN CR) 6.25 MG CR tablet      Continue Ambien and continue to see psychiatry Follow up plan: Return in about 6 months (around 11/11/2020), or if symptoms worsen or fail to improve, for Physical exam and insomnia recheck.  Counseling provided for all of the vaccine components No orders of the defined types were placed in this encounter.   [name], MD [facility] 05/12/2020, 3:51 PM

## 2020-05-14 ENCOUNTER — Other Ambulatory Visit: Payer: Self-pay | Admitting: Family Medicine

## 2020-05-14 DIAGNOSIS — M25561 Pain in right knee: Secondary | ICD-10-CM

## 2020-05-19 ENCOUNTER — Telehealth: Payer: Self-pay

## 2020-05-19 NOTE — Telephone Encounter (Signed)
Ambien refilled on 4/28 #60  Pt takes 1-2 tabs prn  Insurance will only cover 45 tabs every 75 days.  [place],   Can the script be changed?

## 2020-05-20 NOTE — Telephone Encounter (Signed)
Please call her and ask her if she is more often taking 1 tablet or if she is more often taking 2 tablets and we can adjust the prescription.  Please let her know that her insurance will no longer cover the 1 to 2 tablet thing.

## 2020-05-20 NOTE — Telephone Encounter (Signed)
Per pt she does not run the medication through her insurance. She just pays for it out of pocket. She will contact her pharmacy and make them aware.

## 2020-06-15 ENCOUNTER — Other Ambulatory Visit: Payer: Self-pay | Admitting: Family Medicine

## 2020-06-15 DIAGNOSIS — M25561 Pain in right knee: Secondary | ICD-10-CM

## 2020-07-16 ENCOUNTER — Other Ambulatory Visit: Payer: Self-pay | Admitting: Family Medicine

## 2020-07-16 DIAGNOSIS — M25561 Pain in right knee: Secondary | ICD-10-CM

## 2020-08-18 ENCOUNTER — Other Ambulatory Visit: Payer: Self-pay | Admitting: Family Medicine

## 2020-08-18 DIAGNOSIS — M25561 Pain in right knee: Secondary | ICD-10-CM

## 2020-09-18 ENCOUNTER — Other Ambulatory Visit: Payer: Self-pay | Admitting: Family Medicine

## 2020-09-18 DIAGNOSIS — M25561 Pain in right knee: Secondary | ICD-10-CM

## 2020-10-19 ENCOUNTER — Other Ambulatory Visit: Payer: Self-pay | Admitting: Family Medicine

## 2020-10-19 DIAGNOSIS — M25561 Pain in right knee: Secondary | ICD-10-CM

## 2020-11-18 ENCOUNTER — Other Ambulatory Visit: Payer: Self-pay | Admitting: Family Medicine

## 2020-11-18 DIAGNOSIS — M25561 Pain in right knee: Secondary | ICD-10-CM

## 2020-11-21 ENCOUNTER — Ambulatory Visit: Payer: BC Managed Care – PPO | Admitting: Family Medicine

## 2020-11-21 ENCOUNTER — Other Ambulatory Visit: Payer: Self-pay

## 2020-11-21 ENCOUNTER — Other Ambulatory Visit: Payer: Self-pay | Admitting: Family Medicine

## 2020-11-21 ENCOUNTER — Encounter: Payer: Self-pay | Admitting: Family Medicine

## 2020-11-21 VITALS — HR 82 | Ht 62.0 in | Wt 276.0 lb

## 2020-11-21 DIAGNOSIS — Z13220 Encounter for screening for lipoid disorders: Secondary | ICD-10-CM

## 2020-11-21 DIAGNOSIS — G4700 Insomnia, unspecified: Secondary | ICD-10-CM

## 2020-11-21 DIAGNOSIS — G4709 Other insomnia: Secondary | ICD-10-CM | POA: Diagnosis not present

## 2020-11-21 DIAGNOSIS — F32A Depression, unspecified: Secondary | ICD-10-CM

## 2020-11-21 DIAGNOSIS — F419 Anxiety disorder, unspecified: Secondary | ICD-10-CM | POA: Diagnosis not present

## 2020-11-21 DIAGNOSIS — Z131 Encounter for screening for diabetes mellitus: Secondary | ICD-10-CM | POA: Diagnosis not present

## 2020-11-21 DIAGNOSIS — E669 Obesity, unspecified: Secondary | ICD-10-CM

## 2020-11-21 MED ORDER — ZOLPIDEM TARTRATE ER 6.25 MG PO TBCR: 6.2500 mg | EXTENDED_RELEASE_TABLET | Freq: Every evening | ORAL | 5 refills | Status: DC | PRN

## 2020-11-21 NOTE — Progress Notes (Addendum)
Pulse 82   Ht '5\' 2"'  (1.575 m)   Wt 276 lb (125.2 kg)   LMP 06/16/2018 Comment: states no way pregnant  SpO2 94%   BMI 50.48 kg/m    Subjective:   Patient ID: Ruth Rivers, female    DOB: 1975-10-21, 45 y.o.   MRN: [id number]  HPI: Ruth Rivers is a 45 y.o. female presenting on 11/21/2020 for Medical Management of Chronic Issues, Anxiety, Depression, and Insomnia   HPI Anxiety depression insomnia recheck Patient is coming in today for anxiety and insomnia and depression recheck.  She sees [place] for insomnia and then has psychiatry managing the rest.  She seems to be doing well on her medicines as they are working well. She gets Ambien from [place].  Relevant past medical, surgical, family and social history reviewed and updated as indicated. Interim medical history since our last visit reviewed. Allergies and medications reviewed and updated.  Review of Systems  Constitutional:  Negative for fever.  Eyes:  Negative for visual disturbance.  Respiratory:  Negative for chest tightness and shortness of breath.   Cardiovascular:  Negative for chest pain and leg swelling.  Skin:  Negative for rash.  Neurological:  Negative for light-headedness and headaches.  Psychiatric/Behavioral:  Positive for dysphoric mood and sleep disturbance. Negative for agitation, behavioral problems, self-injury and suicidal ideas. The patient is nervous/anxious.   All other systems reviewed and are negative.  Per HPI unless specifically indicated above   Allergies as of 11/21/2020       Reactions   Cephalosporins Itching, Rash   Penicillins Rash        Medication List        Accurate as of November 21, 2020  2:03 PM. If you have any questions, ask your nurse or doctor.          STOP taking these medications    ondansetron 4 MG disintegrating tablet Commonly known as: ZOFRAN-ODT Stopped by: Fransisca Kaufmann Durenda Pechacek, MD       TAKE these medications    buPROPion 300 MG 24 hr tablet Commonly  known as: WELLBUTRIN XL daily.   calcium-vitamin D 500-200 MG-UNIT tablet Commonly known as: OSCAL WITH D Take 1 tablet by mouth daily.   clonazePAM 1 MG tablet Commonly known as: KLONOPIN Take 0.5 mg by mouth as needed.   diclofenac 75 MG EC tablet Commonly known as: VOLTAREN TAKE 1 TABLET BY MOUTH TWICE A DAY   diclofenac Sodium 1 % Gel Commonly known as: Voltaren Apply 2 g topically 4 (four) times daily.   traZODone 50 MG tablet Commonly known as: DESYREL Take 50 mg by mouth at bedtime. 2 -3 tablets at bedtime   vortioxetine HBr 10 MG Tabs tablet Commonly known as: TRINTELLIX Take 5 mg by mouth 2 (two) times daily.   zolpidem 6.25 MG CR tablet Commonly known as: AMBIEN CR Take 1-2 tablets (6.25-12.5 mg total) by mouth at bedtime as needed for sleep.         Objective:   Pulse 82   Ht '5\' 2"'  (1.575 m)   Wt 276 lb (125.2 kg)   LMP 06/16/2018 Comment: states no way pregnant  SpO2 94%   BMI 50.48 kg/m   Wt Readings from Last 3 Encounters:  11/21/20 276 lb (125.2 kg)  05/12/20 268 lb (121.6 kg)  02/18/20 267 lb 6.4 oz (121.3 kg)    Physical Exam Vitals and nursing note reviewed.  Constitutional:      General: She is not in  acute distress.    Appearance: She is well-developed. She is not diaphoretic.  Eyes:     Conjunctiva/sclera: Conjunctivae normal.  Cardiovascular:     Rate and Rhythm: Normal rate and regular rhythm.     Heart sounds: Normal heart sounds. No murmur heard. Pulmonary:     Effort: Pulmonary effort is normal. No respiratory distress.     Breath sounds: Normal breath sounds. No wheezing.  Skin:    General: Skin is warm and dry.     Findings: No rash.  Neurological:     Mental Status: She is alert and oriented to person, place, and time.     Coordination: Coordination normal.  Psychiatric:        Behavior: Behavior normal.      Assessment & Plan:   Problem List Items Addressed This Visit       Other   Obesity   Relevant  Orders   CBC with Differential/Platelet   TSH   Anxiety and depression - Primary   Relevant Orders   CBC with Differential/Platelet   TSH   Insomnia   Relevant Medications   zolpidem (AMBIEN CR) 6.25 MG CR tablet   Other Relevant Orders   ToxASSURE Select 13 (MW), Urine   Other Visit Diagnoses     Lipid screening       Relevant Orders   Lipid panel   Diabetes mellitus screening       Relevant Orders   CBC with Differential/Platelet   CMP14+EGFR       Continue Ambien, will do blood work and urine drug screen.  Patient was not able to leave enough urine, will do blood drug screen Follow up plan: Return in about 6 months (around 05/21/2021), or if symptoms worsen or fail to improve, for Recheck insomnia.  Counseling provided for all of the vaccine components Orders Placed This Encounter  Procedures   ToxASSURE Select 13 (MW), Urine   CBC with Differential/Platelet   CMP14+EGFR   Lipid panel   TSH    [name], MD [facility] 11/21/2020, 2:03 PM

## 2020-11-21 NOTE — Addendum Note (Signed)
Addended by: [name] on: 11/21/2020 02:12 PM   Modules accepted: Orders

## 2020-11-24 LAB — LIPID PANEL
Chol/HDL Ratio: 4.3 ratio (ref 0.0–4.4)
Cholesterol, Total: 195 mg/dL (ref 100–199)
HDL: 45 mg/dL (ref >39)
LDL Chol Calc (NIH): 109 mg/dL — ABNORMAL HIGH (ref 0–99)
Triglycerides: 236 mg/dL — ABNORMAL HIGH (ref 0–149)
VLDL Cholesterol Cal: 41 mg/dL — ABNORMAL HIGH (ref 5–40)

## 2020-11-24 LAB — CMP14+EGFR
ALT: 20 IU/L (ref 0–32)
AST: 18 IU/L (ref 0–40)
Albumin/Globulin Ratio: 1.5 (ref 1.2–2.2)
Albumin: 4 g/dL (ref 3.8–4.8)
Alkaline Phosphatase: 77 IU/L (ref 44–121)
BUN/Creatinine Ratio: 15 (ref 9–23)
BUN: 15 mg/dL (ref 6–24)
Bilirubin Total: <0.2 mg/dL (ref 0.0–1.2)
CO2: 24 mmol/L (ref 20–29)
Calcium: 8.9 mg/dL (ref 8.7–10.2)
Chloride: 102 mmol/L (ref 96–106)
Creatinine, Ser: 0.97 mg/dL (ref 0.57–1.00)
Globulin, Total: 2.6 g/dL (ref 1.5–4.5)
Glucose: 109 mg/dL — ABNORMAL HIGH (ref 70–99)
Potassium: 3.8 mmol/L (ref 3.5–5.2)
Sodium: 139 mmol/L (ref 134–144)
Total Protein: 6.6 g/dL (ref 6.0–8.5)
eGFR: 73 mL/min/{1.73_m2} (ref >59)

## 2020-11-24 LAB — CBC WITH DIFFERENTIAL/PLATELET
Basophils Absolute: 0.1 10*3/uL (ref 0.0–0.2)
Basos: 1 %
EOS (ABSOLUTE): 0.1 10*3/uL (ref 0.0–0.4)
Eos: 1 %
Hematocrit: 42.5 % (ref 34.0–46.6)
Hemoglobin: 14.3 g/dL (ref 11.1–15.9)
Immature Grans (Abs): 0 10*3/uL (ref 0.0–0.1)
Immature Granulocytes: 0 %
Lymphocytes Absolute: 2.8 10*3/uL (ref 0.7–3.1)
Lymphs: 25 %
MCH: 30.9 pg (ref 26.6–33.0)
MCHC: 33.6 g/dL (ref 31.5–35.7)
MCV: 92 fL (ref 79–97)
Monocytes Absolute: 0.9 10*3/uL (ref 0.1–0.9)
Monocytes: 8 %
Neutrophils Absolute: 7.5 10*3/uL — ABNORMAL HIGH (ref 1.4–7.0)
Neutrophils: 65 %
Platelets: 275 10*3/uL (ref 150–450)
RBC: 4.63 x10E6/uL (ref 3.77–5.28)
RDW: 11.6 % — ABNORMAL LOW (ref 11.7–15.4)
WBC: 11.4 10*3/uL — ABNORMAL HIGH (ref 3.4–10.8)

## 2020-11-24 LAB — TSH: TSH: 2.12 u[IU]/mL (ref 0.450–4.500)

## 2020-11-24 LAB — DRUG SCREEN 10 W/CONF, SERUM
Amphetamines, IA: NEGATIVE ng/mL
Barbiturates, IA: NEGATIVE ug/mL
Benzodiazepines, IA: NEGATIVE ng/mL
Cocaine & Metabolite, IA: NEGATIVE ng/mL
Methadone, IA: NEGATIVE ng/mL
Opiates, IA: NEGATIVE ng/mL
Oxycodones, IA: NEGATIVE ng/mL
Phencyclidine, IA: NEGATIVE ng/mL
Propoxyphene, IA: NEGATIVE ng/mL
THC(Marijuana) Metabolite, IA: NEGATIVE ng/mL

## 2021-03-07 ENCOUNTER — Other Ambulatory Visit: Payer: Self-pay | Admitting: Family Medicine

## 2021-03-07 DIAGNOSIS — M25561 Pain in right knee: Secondary | ICD-10-CM

## 2021-03-28 ENCOUNTER — Encounter: Payer: Self-pay | Admitting: Nurse Practitioner

## 2021-03-28 ENCOUNTER — Ambulatory Visit: Payer: BC Managed Care – PPO | Admitting: Nurse Practitioner

## 2021-03-28 VITALS — BP 109/72 | HR 79 | Temp 98.9°F | Resp 20 | Ht 62.0 in | Wt 286.0 lb

## 2021-03-28 DIAGNOSIS — M545 Low back pain, unspecified: Secondary | ICD-10-CM | POA: Diagnosis not present

## 2021-03-28 MED ORDER — METHYLPREDNISOLONE ACETATE 80 MG/ML IJ SUSP
80.0000 mg | Freq: Once | INTRAMUSCULAR | Status: AC
  Administered 2021-03-28: 80 mg via INTRAMUSCULAR

## 2021-03-28 MED ORDER — PREDNISONE 20 MG PO TABS: ORAL_TABLET | ORAL | 0 refills | Status: DC

## 2021-03-28 MED ORDER — KETOROLAC TROMETHAMINE 60 MG/2ML IM SOLN
60.0000 mg | Freq: Once | INTRAMUSCULAR | Status: AC
  Administered 2021-03-28: 60 mg via INTRAMUSCULAR

## 2021-03-28 NOTE — Progress Notes (Signed)
? ?  Subjective:  ? ? Patient ID: Ruth Rivers, female    DOB: 09/28/1975, 46 y.o.   MRN: [id number] ? ? ? ?Chief Complaint: Back Pain ? ? ?Back Pain ?This is a new problem. The current episode started yesterday. The problem occurs constantly. The problem has been waxing and waning since onset. The pain is present in the lumbar spine (pressure feeling and some stabbing when she moves.). The pain does not radiate. The pain is at a severity of 7/10. The pain is moderate. The pain is The same all the time. The symptoms are aggravated by twisting, bending, lying down and position. Pertinent negatives include no abdominal pain, chest pain, headaches or weakness. Risk factors include sedentary lifestyle. She has tried NSAIDs for the symptoms. The treatment provided mild relief.  ? ? ? ?Review of Systems  ?Constitutional:  Negative for diaphoresis.  ?Eyes:  Negative for pain.  ?Respiratory:  Negative for shortness of breath.   ?Cardiovascular:  Negative for chest pain, palpitations and leg swelling.  ?Gastrointestinal:  Negative for abdominal pain.  ?Endocrine: Negative for polydipsia.  ?Musculoskeletal:  Positive for back pain.  ?Skin:  Negative for rash.  ?Neurological:  Negative for dizziness, weakness and headaches.  ?Hematological:  Does not bruise/bleed easily.  ?All other systems reviewed and are negative. ? ?   ?Objective:  ? Physical Exam ?Constitutional:   ?   Appearance: Normal appearance.  ?Cardiovascular:  ?   Rate and Rhythm: Normal rate and regular rhythm.  ?   Heart sounds: Normal heart sounds.  ?Pulmonary:  ?   Effort: Pulmonary effort is normal.  ?   Breath sounds: Normal breath sounds.  ?Musculoskeletal:  ?   Comments: Rises slowly from sitting to standing ?Decreased ROM of lumbar spine due  to pain on movemnet in any direction. ?Gait slow and steady ?(-) SLR bil ?Motor strength and sensation distally intact  ?Skin: ?   General: Skin is warm.  ?Neurological:  ?   General: No focal deficit present.  ?    Mental Status: She is alert and oriented to person, place, and time.  ?Psychiatric:     ?   Mood and Affect: Mood normal.     ?   Behavior: Behavior normal.  ? ? ?BP 109/72   Pulse 79   Temp 98.9 ?F (37.2 ?C) (Temporal)   Resp 20   Ht '5\' 2"'$  (1.575 m)   Wt 286 lb (129.7 kg)   LMP 06/16/2018 Comment: states no way pregnant  BMI 52.31 kg/m?  ? ? ? ?   ?Assessment & Plan:  ?Ruth Rivers in today with chief complaint of Back Pain ? ? ?1. Acute midline low back pain without sciatica ?Moist heat  ?Rest ?Follow up prn ?- methylPREDNISolone acetate (DEPO-MEDROL) injection 80 mg ?- ketorolac (TORADOL) injection 60 mg ? ? ? ?The above assessment and management plan was discussed with the patient. The patient verbalized understanding of and has agreed to the management plan. Patient is aware to call the clinic if symptoms persist or worsen. Patient is aware when to return to the clinic for a follow-up visit. Patient educated on when it is appropriate to go to the emergency department.  ? ?Mary-Margaret Hassell Done, FNP ? ? ? ?

## 2021-03-28 NOTE — Patient Instructions (Signed)

## 2021-04-08 ENCOUNTER — Other Ambulatory Visit: Payer: Self-pay | Admitting: Family Medicine

## 2021-04-08 DIAGNOSIS — M25561 Pain in right knee: Secondary | ICD-10-CM

## 2021-04-18 ENCOUNTER — Encounter: Payer: Self-pay | Admitting: Family

## 2021-04-18 ENCOUNTER — Ambulatory Visit: Payer: BC Managed Care – PPO | Admitting: Family

## 2021-04-18 DIAGNOSIS — U071 COVID-19: Secondary | ICD-10-CM

## 2021-04-18 NOTE — Patient Instructions (Signed)
10 Things You Can Do to Manage Your COVID-19 Symptoms at Home ?If you have possible or confirmed COVID-19 ?Stay home except to get medical care. ?Monitor your symptoms carefully. If your symptoms get worse, call your healthcare provider immediately. ?Get rest and stay hydrated. ?If you have a medical appointment, call the healthcare provider ahead of time and tell them that you have or may have COVID-19. ?For medical emergencies, call 911 and notify the dispatch personnel that you have or may have COVID-19. ?Cover your cough and sneezes with a tissue or use the inside of your elbow. ?Wash your hands often with soap and water for at least 20 seconds or clean your hands with an alcohol-based hand sanitizer that contains at least 60% alcohol. ?As much as possible, stay in a specific room and away from other people in your home. Also, you should use a separate bathroom, if available. If you need to be around other people in or outside of the home, wear a mask. ?Avoid sharing personal items with other people in your household, like dishes, towels, and bedding. ?Clean all surfaces that are touched often, like counters, tabletops, and doorknobs. Use household cleaning sprays or wipes according to the label instructions. ?cdc.gov/coronavirus ?07/31/2019 ?This information is not intended to replace advice given to you by your health care provider. Make sure you discuss any questions you have with your health care provider. ?Document Revised: 09/23/2020 Document Reviewed: 09/23/2020 ?Elsevier Patient Education ? 2022 Elsevier Inc. ? ?

## 2021-04-18 NOTE — Progress Notes (Signed)
? ?Virtual Visit  Note ?Due to COVID-19 pandemic this visit was conducted virtually. This visit type was conducted due to national recommendations for restrictions regarding the COVID-19 Pandemic (e.g. social distancing, sheltering in place) in an effort to limit this patient's exposure and mitigate transmission in our community. All issues noted in this document were discussed and addressed.  A physical exam was not performed with this format. ? ?I connected with Ruth Rivers on 04/18/21 at 10:00 AM  by telephone and verified that I am speaking with the correct person using two identifiers. Ruth Rivers is currently located at school and no one is currently with her during visit. The provider, Evelina Dun, FNP is located in their office at time of visit. ? ?I discussed the limitations, risks, security and privacy concerns of performing an evaluation and management service by telephone and the availability of in person appointments. I also discussed with the patient that there may be a patient responsible charge related to this service. The patient expressed understanding and agreed to proceed. ? ? ?Ms. [name],you are scheduled for a virtual visit with your provider today.   ? ?Just as we do with appointments in the office, we must obtain your consent to participate.  Your consent will be active for this visit and any virtual visit you may have with one of our providers in the next 365 days.   ? ?If you have a MyChart account, I can also send a copy of this consent to you electronically.  All virtual visits are billed to your insurance company just like a traditional visit in the office.  As this is a virtual visit, video technology does not allow for your provider to perform a traditional examination.  This may limit your provider's ability to fully assess your condition.  If your provider identifies any concerns that need to be evaluated in person or the need to arrange testing such as labs, EKG, etc, we will  make arrangements to do so.   ? ?Although advances in technology are sophisticated, we cannot ensure that it will always work on either your end or our end.  If the connection with a video visit is poor, we may have to switch to a telephone visit.  With either a video or telephone visit, we are not always able to ensure that we have a secure connection.   I need to obtain your verbal consent now.   Are you willing to proceed with your visit today?  ? ?Ruth Rivers has provided verbal consent on 04/18/2021 for a virtual visit (video or telephone). ? ? ?Evelina Dun, FNP ?04/18/2021  10:01 AM ? ? ?History and Present Illness: ? ?Pt calls the office today with positive COVID. She reports her symptoms started Sunday evening and had a positive test yesterday.  ?Cough ?This is a new problem. The current episode started in the past 7 days. The problem has been gradually improving. The problem occurs every few minutes. The cough is Non-productive. Associated symptoms include chills, a fever, headaches, myalgias, nasal congestion, postnasal drip and a sore throat. Pertinent negatives include no ear congestion, ear pain, shortness of breath or wheezing. She has tried rest and OTC inhaler for the symptoms. The treatment provided mild relief.  ? ? ? ?Review of Systems  ?Constitutional:  Positive for chills and fever.  ?HENT:  Positive for postnasal drip and sore throat. Negative for ear pain.   ?Respiratory:  Positive for cough. Negative for shortness of breath and wheezing.   ?  Musculoskeletal:  Positive for myalgias.  ?Neurological:  Positive for headaches.  ? ? ?Observations/Objective: ?No SOB or distress noted ? ?Assessment and Plan: ?1. COVID-19 ?COVID positive, rest, force fluids, tylenol as needed, Quarantine for at least 5 days and you are fever free, then must wear a mask out in public from day 3-84, report any worsening symptoms such as increased shortness of breath, swelling, or continued high fevers. Given current  symptoms, will hold off on antivirals at this time. Work note given.  ? ? ?  ?I discussed the assessment and treatment plan with the patient. The patient was provided an opportunity to ask questions and all were answered. The patient agreed with the plan and demonstrated an understanding of the instructions. ?  ?The patient was advised to call back or seek an in-person evaluation if the symptoms worsen or if the condition fails to improve as anticipated. ? ?The above assessment and management plan was discussed with the patient. The patient verbalized understanding of and has agreed to the management plan. Patient is aware to call the clinic if symptoms persist or worsen. Patient is aware when to return to the clinic for a follow-up visit. Patient educated on when it is appropriate to go to the emergency department.  ? ?Time call ended: 10:11 AM   ? ?I provided 11 minutes of  non face-to-face time during this encounter. ? ? ? ?Evelina Dun, FNP ? ? ?

## 2021-05-04 ENCOUNTER — Telehealth: Payer: Self-pay | Admitting: Family Medicine

## 2021-05-04 NOTE — Telephone Encounter (Signed)
Pt last seen in November. #60 with 5 refills were given then. You advised pt to follow up around 5/7 for 32mfollow up. Pt must not have made follow up after the appt in November. Regardless, should she have enough med to make it until 5/7? ? ?Do you want to send in refill or work her in sooner. Drug screen normal at November visit. ?

## 2021-05-04 NOTE — Telephone Encounter (Signed)
Added to wait list.

## 2021-05-04 NOTE — Telephone Encounter (Signed)
Patient aware. Sent to [facility] to put on cancellation list.  ?

## 2021-05-04 NOTE — Telephone Encounter (Signed)
Unfortunately cannot be filled outside of a visit, she can be put on a wait list and see if anything opens up sooner ?

## 2021-06-19 ENCOUNTER — Ambulatory Visit: Payer: BC Managed Care – PPO | Admitting: Family Medicine

## 2021-06-19 ENCOUNTER — Encounter: Payer: Self-pay | Admitting: Family Medicine

## 2021-06-19 VITALS — BP 104/74 | HR 72 | Temp 97.7°F | Ht 62.0 in | Wt 282.0 lb

## 2021-06-19 DIAGNOSIS — F32A Depression, unspecified: Secondary | ICD-10-CM

## 2021-06-19 DIAGNOSIS — G4709 Other insomnia: Secondary | ICD-10-CM

## 2021-06-19 DIAGNOSIS — M1711 Unilateral primary osteoarthritis, right knee: Secondary | ICD-10-CM

## 2021-06-19 DIAGNOSIS — F419 Anxiety disorder, unspecified: Secondary | ICD-10-CM | POA: Diagnosis not present

## 2021-06-19 DIAGNOSIS — E785 Hyperlipidemia, unspecified: Secondary | ICD-10-CM | POA: Diagnosis not present

## 2021-06-19 DIAGNOSIS — Z7184 Encounter for health counseling related to travel: Secondary | ICD-10-CM

## 2021-06-19 MED ORDER — SCOPOLAMINE 1 MG/3DAYS TD PT72: 1.0000 | MEDICATED_PATCH | TRANSDERMAL | 1 refills | Status: DC

## 2021-06-19 MED ORDER — ZOLPIDEM TARTRATE ER 6.25 MG PO TBCR: 6.2500 mg | EXTENDED_RELEASE_TABLET | Freq: Every evening | ORAL | 5 refills | Status: DC | PRN

## 2021-06-19 MED ORDER — DICLOFENAC SODIUM 75 MG PO TBEC: 75.0000 mg | DELAYED_RELEASE_TABLET | Freq: Two times a day (BID) | ORAL | 2 refills | Status: DC

## 2021-06-19 NOTE — Progress Notes (Signed)
BP 104/74   Pulse 72   Temp 97.7 F (36.5 C)   Ht _0  (1.575 m)   Wt 282 lb (127.9 kg)   LMP 06/16/2018 Comment: states no way pregnant  SpO2 98%   BMI 51.58 kg/m    Subjective:   Patient ID: Ruth Rivers, female    DOB: 01-19-1975, 46 y.o.   MRN: [id number]  HPI: Ruth Rivers is a 46 y.o. female presenting on 06/19/2021 for Medical Management of Chronic Issues, Anxiety, and Depression   HPI Insomnia, patient sees psychiatry for anxiety and depression Current rx-Ambien 6.25 mg, take 1 to 2 tablets nightly as needed # meds rx-60 Effectiveness of current meds-works well Adverse reactions form meds-none  Pill count performed-No Last drug screen -12/30/2020 ( high risk q55m moderate risk q688mlow risk yearly ) Urine drug screen today- No Was [place]yes  If yes were their any concerning findings? -None  No flowsheet data found.   Controlled substance contract signed on: 12/30/2020  Hyperlipidemia Patient is coming in for recheck of his hyperlipidemia. The patient is currently taking no medicine, trying diet control. They deny any issues with myalgias or history of liver damage from it. They deny any focal numbness or weakness or chest pain.   Relevant past medical, surgical, family and social history reviewed and updated as indicated. Interim medical history since our last visit reviewed. Allergies and medications reviewed and updated.  Review of Systems  Constitutional:  Negative for chills and fever.  Eyes:  Negative for visual disturbance.  Respiratory:  Negative for chest tightness and shortness of breath.   Cardiovascular:  Negative for chest pain and leg swelling.  Genitourinary:  Negative for difficulty urinating and dysuria.  Musculoskeletal:  Negative for back pain and gait problem.  Skin:  Negative for rash.  Neurological:  Negative for light-headedness and headaches.  Psychiatric/Behavioral:  Positive for dysphoric mood and sleep disturbance.  Negative for agitation, behavioral problems, self-injury and suicidal ideas. The patient is nervous/anxious.   All other systems reviewed and are negative.  Per HPI unless specifically indicated above   Allergies as of 06/19/2021       Reactions   Cephalosporins Itching, Rash   Penicillins Rash        Medication List        Accurate as of June 19, 2021  4:14 PM. If you have any questions, ask your nurse or doctor.          buPROPion 300 MG 24 hr tablet Commonly known as: WELLBUTRIN XL daily.   calcium-vitamin D 500-200 MG-UNIT tablet Commonly known as: OSCAL WITH D Take 1 tablet by mouth daily.   clonazePAM 1 MG tablet Commonly known as: KLONOPIN Take 0.5 mg by mouth as needed.   diclofenac 75 MG EC tablet Commonly known as: VOLTAREN Take 1 tablet (75 mg total) by mouth 2 (two) times daily.   diclofenac Sodium 1 % Gel Commonly known as: Voltaren Apply 2 g topically 4 (four) times daily.   predniSONE 20 MG tablet Commonly known as: DELTASONE 2 po at sametime daily for 5 days-   traZODone 50 MG tablet Commonly known as: DESYREL Take 50 mg by mouth at bedtime. 2 -3 tablets at bedtime   vortioxetine HBr 10 MG Tabs tablet Commonly known as: TRINTELLIX Take 5 mg by mouth 2 (two) times daily.   zolpidem 6.25 MG CR tablet Commonly known as: AMBIEN CR Take 1-2 tablets (6.25-12.5 mg total) by mouth at bedtime as  needed for sleep.         Objective:   BP 104/74   Pulse 72   Temp 97.7 F (36.5 C)   Ht _0  (1.575 m)   Wt 282 lb (127.9 kg)   LMP 06/16/2018 Comment: states no way pregnant  SpO2 98%   BMI 51.58 kg/m   Wt Readings from Last 3 Encounters:  06/19/21 282 lb (127.9 kg)  03/28/21 286 lb (129.7 kg)  11/21/20 276 lb (125.2 kg)    Physical Exam Vitals and nursing note reviewed.  Constitutional:      General: She is not in acute distress.    Appearance: She is well-developed. She is not diaphoretic.  Eyes:     Conjunctiva/sclera:  Conjunctivae normal.     Pupils: Pupils are equal, round, and reactive to light.  Cardiovascular:     Rate and Rhythm: Normal rate and regular rhythm.     Heart sounds: Normal heart sounds. No murmur heard. Pulmonary:     Effort: Pulmonary effort is normal. No respiratory distress.     Breath sounds: Normal breath sounds. No wheezing.  Musculoskeletal:        General: No tenderness. Normal range of motion.  Skin:    General: Skin is warm and dry.     Findings: No rash.  Neurological:     Mental Status: She is alert and oriented to person, place, and time.     Coordination: Coordination normal.  Psychiatric:        Behavior: Behavior normal.      Assessment & Plan:   Problem List Items Addressed This Visit       Other   Anxiety and depression   Relevant Orders   CBC with Differential/Platelet   CMP14+EGFR   Insomnia - Primary   Relevant Medications   zolpidem (AMBIEN CR) 6.25 MG CR tablet   Other Relevant Orders   CBC with Differential/Platelet   Dyslipidemia   Relevant Orders   CBC with Differential/Platelet   CMP14+EGFR   Lipid panel   Other Visit Diagnoses     Primary osteoarthritis of right knee       Relevant Medications   diclofenac (VOLTAREN) 75 MG EC tablet   Other Relevant Orders   CBC with Differential/Platelet   Travel advice encounter       Relevant Medications   scopolamine (TRANSDERM-SCOP) 1 MG/3DAYS       We will do refills, will check blood work and cholesterol today.  Discussed dietary changes for it. Follow up plan: Return in about 6 months (around 12/19/2021), or if symptoms worsen or fail to improve, for Anxiety depression and insomnia and cholesterol recheck.  Counseling provided for all of the vaccine components No orders of the defined types were placed in this encounter.   [name], MD [facility] 06/19/2021, 4:14 PM

## 2021-06-20 LAB — CBC WITH DIFFERENTIAL/PLATELET
Basophils Absolute: 0.1 10*3/uL (ref 0.0–0.2)
Basos: 1 %
EOS (ABSOLUTE): 0.1 10*3/uL (ref 0.0–0.4)
Eos: 2 %
Hematocrit: 40.4 % (ref 34.0–46.6)
Hemoglobin: 13.4 g/dL (ref 11.1–15.9)
Immature Grans (Abs): 0.1 10*3/uL (ref 0.0–0.1)
Immature Granulocytes: 1 %
Lymphocytes Absolute: 2.4 10*3/uL (ref 0.7–3.1)
Lymphs: 27 %
MCH: 30.9 pg (ref 26.6–33.0)
MCHC: 33.2 g/dL (ref 31.5–35.7)
MCV: 93 fL (ref 79–97)
Monocytes Absolute: 0.9 10*3/uL (ref 0.1–0.9)
Monocytes: 10 %
Neutrophils Absolute: 5.2 10*3/uL (ref 1.4–7.0)
Neutrophils: 59 %
Platelets: 258 10*3/uL (ref 150–450)
RBC: 4.34 x10E6/uL (ref 3.77–5.28)
RDW: 12 % (ref 11.7–15.4)
WBC: 8.7 10*3/uL (ref 3.4–10.8)

## 2021-06-20 LAB — CMP14+EGFR
ALT: 17 IU/L (ref 0–32)
AST: 11 IU/L (ref 0–40)
Albumin/Globulin Ratio: 1.6 (ref 1.2–2.2)
Albumin: 3.8 g/dL (ref 3.8–4.8)
Alkaline Phosphatase: 60 IU/L (ref 44–121)
BUN/Creatinine Ratio: 20 (ref 9–23)
BUN: 17 mg/dL (ref 6–24)
Bilirubin Total: <0.2 mg/dL (ref 0.0–1.2)
CO2: 25 mmol/L (ref 20–29)
Calcium: 8.3 mg/dL — ABNORMAL LOW (ref 8.7–10.2)
Chloride: 101 mmol/L (ref 96–106)
Creatinine, Ser: 0.84 mg/dL (ref 0.57–1.00)
Globulin, Total: 2.4 g/dL (ref 1.5–4.5)
Glucose: 83 mg/dL (ref 70–99)
Potassium: 4.2 mmol/L (ref 3.5–5.2)
Sodium: 138 mmol/L (ref 134–144)
Total Protein: 6.2 g/dL (ref 6.0–8.5)
eGFR: 86 mL/min/{1.73_m2} (ref >59)

## 2021-06-20 LAB — LIPID PANEL
Chol/HDL Ratio: 4.5 ratio — ABNORMAL HIGH (ref 0.0–4.4)
Cholesterol, Total: 192 mg/dL (ref 100–199)
HDL: 43 mg/dL (ref >39)
LDL Chol Calc (NIH): 117 mg/dL — ABNORMAL HIGH (ref 0–99)
Triglycerides: 183 mg/dL — ABNORMAL HIGH (ref 0–149)
VLDL Cholesterol Cal: 32 mg/dL (ref 5–40)

## 2021-06-26 ENCOUNTER — Encounter: Payer: Self-pay | Admitting: *Deleted

## 2021-09-05 ENCOUNTER — Encounter: Payer: Self-pay | Admitting: Nurse Practitioner

## 2021-09-05 ENCOUNTER — Ambulatory Visit: Payer: BC Managed Care – PPO | Admitting: Nurse Practitioner

## 2021-09-05 ENCOUNTER — Telehealth: Payer: Self-pay | Admitting: Family Medicine

## 2021-09-05 ENCOUNTER — Other Ambulatory Visit: Payer: Self-pay | Admitting: Family Medicine

## 2021-09-05 VITALS — BP 107/76 | HR 77 | Temp 98.4°F | Ht 62.0 in | Wt 277.4 lb

## 2021-09-05 DIAGNOSIS — M5430 Sciatica, unspecified side: Secondary | ICD-10-CM

## 2021-09-05 DIAGNOSIS — M549 Dorsalgia, unspecified: Secondary | ICD-10-CM | POA: Diagnosis not present

## 2021-09-05 DIAGNOSIS — Z7184 Encounter for health counseling related to travel: Secondary | ICD-10-CM

## 2021-09-05 MED ORDER — METHOCARBAMOL 500 MG PO TABS: 500.0000 mg | ORAL_TABLET | Freq: Four times a day (QID) | ORAL | 0 refills | Status: DC

## 2021-09-05 MED ORDER — PREDNISONE 20 MG PO TABS: 20.0000 mg | ORAL_TABLET | Freq: Every day | ORAL | 0 refills | Status: DC

## 2021-09-05 MED ORDER — METHYLPREDNISOLONE ACETATE 80 MG/ML IJ SUSP
80.0000 mg | Freq: Once | INTRAMUSCULAR | Status: AC
  Administered 2021-09-05: 80 mg via INTRAMUSCULAR

## 2021-09-05 NOTE — Telephone Encounter (Signed)
Prednisone and Methacarbamol sent to [facility] in [place]. Pt informed.

## 2021-09-05 NOTE — Patient Instructions (Signed)
Acute Back Pain, Adult Acute back pain is sudden and usually short-lived. It is often caused by an injury to the muscles and tissues in the back. The injury may result from: A muscle, tendon, or ligament getting overstretched or torn. Ligaments are tissues that connect bones to each other. Lifting something improperly can cause a back strain. Wear and tear (degeneration) of the spinal disks. Spinal disks are circular tissue that provide cushioning between the bones of the spine (vertebrae). Twisting motions, such as while playing sports or doing yard work. A hit to the back. Arthritis. You may have a physical exam, lab tests, and imaging tests to find the cause of your pain. Acute back pain usually goes away with rest and home care. Follow these instructions at home: Managing pain, stiffness, and swelling Take over-the-counter and prescription medicines only as told by your health care provider. Treatment may include medicines for pain and inflammation that are taken by mouth or applied to the skin, or muscle relaxants. Your health care provider may recommend applying ice during the first 24-48 hours after your pain starts. To do this: Put ice in a plastic bag. Place a towel between your skin and the bag. Leave the ice on for 20 minutes, 2-3 times a day. Remove the ice if your skin turns bright red. This is very important. If you cannot feel pain, heat, or cold, you have a greater risk of damage to the area. If directed, apply heat to the affected area as often as told by your health care provider. Use the heat source that your health care provider recommends, such as a moist heat pack or a heating pad. Place a towel between your skin and the heat source. Leave the heat on for 20-30 minutes. Remove the heat if your skin turns bright red. This is especially important if you are unable to feel pain, heat, or cold. You have a greater risk of getting burned. Activity  Do not stay in bed. Staying in  bed for more than 1-2 days can delay your recovery. Sit up and stand up straight. Avoid leaning forward when you sit or hunching over when you stand. If you work at a desk, sit close to it so you do not need to lean over. Keep your chin tucked in. Keep your neck drawn back, and keep your elbows bent at a 90-degree angle (right angle). Sit high and close to the steering wheel when you drive. Add lower back (lumbar) support to your car seat, if needed. Take short walks on even surfaces as soon as you are able. Try to increase the length of time you walk each day. Do not sit, drive, or stand in one place for more than 30 minutes at a time. Sitting or standing for long periods of time can put stress on your back. Do not drive or use heavy machinery while taking prescription pain medicine. Use proper lifting techniques. When you bend and lift, use positions that put less stress on your back: Bend your knees. Keep the load close to your body. Avoid twisting. Exercise regularly as told by your health care provider. Exercising helps your back heal faster and helps prevent back injuries by keeping muscles strong and flexible. Work with a physical therapist to make a safe exercise program, as recommended by your health care provider. Do any exercises as told by your physical therapist. Lifestyle Maintain a healthy weight. Extra weight puts stress on your back and makes it difficult to have good   posture. Avoid activities or situations that make you feel anxious or stressed. Stress and anxiety increase muscle tension and can make back pain worse. Learn ways to manage anxiety and stress, such as through exercise. General instructions Sleep on a firm mattress in a comfortable position. Try lying on your side with your knees slightly bent. If you lie on your back, put a pillow under your knees. Keep your head and neck in a straight line with your spine (neutral position) when using electronic equipment like  smartphones or pads. To do this: Raise your smartphone or pad to look at it instead of bending your head or neck to look down. Put the smartphone or pad at the level of your face while looking at the screen. Follow your treatment plan as told by your health care provider. This may include: Cognitive or behavioral therapy. Acupuncture or massage therapy. Meditation or yoga. Contact a health care provider if: You have pain that is not relieved with rest or medicine. You have increasing pain going down into your legs or buttocks. Your pain does not improve after 2 weeks. You have pain at night. You lose weight without trying. You have a fever or chills. You develop nausea or vomiting. You develop abdominal pain. Get help right away if: You develop new bowel or bladder control problems. You have unusual weakness or numbness in your arms or legs. You feel faint. These symptoms may represent a serious problem that is an emergency. Do not wait to see if the symptoms will go away. Get medical help right away. Call your local emergency services (911 in the [place] Do not drive yourself to the hospital. Summary Acute back pain is sudden and usually short-lived. Use proper lifting techniques. When you bend and lift, use positions that put less stress on your back. Take over-the-counter and prescription medicines only as told by your health care provider, and apply heat or ice as told. This information is not intended to replace advice given to you by your health care provider. Make sure you discuss any questions you have with your health care provider. Document Revised: 03/25/2020 Document Reviewed: 03/25/2020 Elsevier Patient Education  2023 [employer]vier Inc[employer]  

## 2021-09-05 NOTE — Progress Notes (Signed)
Acute Office Visit  Subjective:     Patient ID: Ruth Rivers, female    DOB: November 22, 1975, 46 y.o.   MRN: [id number]  Chief Complaint  Patient presents with   Back Pain    Pt state this has been going on for about 2 days now , it is going down her left leg     Back Pain This is a chronic problem. The problem occurs constantly. The problem is unchanged. The pain is present in the lumbar spine. The pain is at a severity of 8/10. The pain is severe. The pain is The same all the time. The symptoms are aggravated by bending and position. Associated symptoms include leg pain. Pertinent negatives include no abdominal pain, bladder incontinence, headaches, numbness, paresis, paresthesias, perianal numbness or tingling. She has tried ice and NSAIDs for the symptoms. The treatment provided no relief.     Review of Systems  Constitutional: Negative.  Negative for chills.  HENT: Negative.    Respiratory: Negative.    Cardiovascular: Negative.   Gastrointestinal:  Negative for abdominal pain.  Genitourinary:  Negative for bladder incontinence.  Musculoskeletal:  Positive for back pain.  Skin: Negative.  Negative for itching and rash.  Neurological:  Negative for tingling, numbness, headaches and paresthesias.  Psychiatric/Behavioral: Negative.    All other systems reviewed and are negative.       Objective:    BP 107/76   Pulse 77   Temp 98.4 F (36.9 C)   Ht '5\' 2"'$  (1.575 m)   Wt 277 lb 6.4 oz (125.8 kg)   LMP 06/16/2018 Comment: states no way pregnant  SpO2 97%   BMI 50.74 kg/m  BP Readings from Last 3 Encounters:  09/05/21 107/76  06/19/21 104/74  03/28/21 109/72   Wt Readings from Last 3 Encounters:  09/05/21 277 lb 6.4 oz (125.8 kg)  06/19/21 282 lb (127.9 kg)  03/28/21 286 lb (129.7 kg)      Physical Exam Vitals and nursing note reviewed.  Constitutional:      Appearance: Normal appearance. She is obese.  HENT:     Head: Normocephalic.     Right Ear: External  ear normal.     Left Ear: External ear normal.     Nose: Nose normal.     Mouth/Throat:     Mouth: Mucous membranes are moist.     Pharynx: Oropharynx is clear.  Eyes:     Conjunctiva/sclera: Conjunctivae normal.  Cardiovascular:     Rate and Rhythm: Normal rate and regular rhythm.     Pulses: Normal pulses.     Heart sounds: Normal heart sounds.  Pulmonary:     Effort: Pulmonary effort is normal.     Breath sounds: Normal breath sounds.  Abdominal:     General: Bowel sounds are normal.  Musculoskeletal:     Lumbar back: Tenderness present. Decreased range of motion.  Skin:    Findings: No erythema or rash.  Neurological:     General: No focal deficit present.     Mental Status: She is alert and oriented to person, place, and time.     No results found for any visits on 09/05/21.      Assessment & Plan:  Symptoms of back pain with sciatica not well controlled in the past 2 days.  Patient has had a history of back pain in the past.  Advised patient to rest back, bend using knees to pick up weights, Robaxin for musculoskeletal pain, ibuprofen 600 mg  tablet by mouth as needed for pain.  40 Depo-Medrol shot given in clinic.  Prednisone 20 mg tablet by mouth daily for 6 days.  Education provided to patient printed handouts given.  Follow-up for worsening unresolved symptoms. Problem List Items Addressed This Visit   None Visit Diagnoses     Back pain with sciatica    -  Primary   Relevant Medications   methylPREDNISolone acetate (DEPO-MEDROL) injection 80 mg (Completed)       Meds ordered this encounter  Medications   DISCONTD: predniSONE (DELTASONE) 20 MG tablet    Sig: Take 1 tablet (20 mg total) by mouth daily with breakfast.    Dispense:  6 tablet    Refill:  0    Order Specific Question:   Supervising Provider    Answer:   Claretta Fraise [[id number]]   DISCONTD: methocarbamol (ROBAXIN) 500 MG tablet    Sig: Take 1 tablet (500 mg total) by mouth 4 (four) times  daily.    Dispense:  30 tablet    Refill:  0    Order Specific Question:   Supervising Provider    Answer:   Claretta Fraise [phone]   methylPREDNISolone acetate (DEPO-MEDROL) injection 80 mg    Return if symptoms worsen or fail to improve, for Back pain.  Ivy Lynn, NP

## 2021-09-05 NOTE — Telephone Encounter (Signed)
Last office visit 06/19/21 Last refill 06/19/21 #10, 1 refill

## 2021-09-08 ENCOUNTER — Telehealth: Payer: Self-pay | Admitting: Family Medicine

## 2021-09-08 NOTE — Telephone Encounter (Signed)
The neck step would be to do either imaging or physical therapy referral, neither of which can happen this week because it is Friday and at the end of the week.  Have her continue with the muscle relaxers and anti-inflammatories and if no better next week then we can have her come in and we can consider imaging or PT referral

## 2021-09-08 NOTE — Telephone Encounter (Signed)
Patient calling because her back pain is not getting any better, it is getting worse. She was seen on 8/22. Please call back and advise.

## 2021-09-11 ENCOUNTER — Other Ambulatory Visit: Payer: Self-pay | Admitting: Nurse Practitioner

## 2021-09-11 DIAGNOSIS — M549 Dorsalgia, unspecified: Secondary | ICD-10-CM

## 2021-09-11 NOTE — Telephone Encounter (Signed)
Pt aware - has appt Friday for f/u

## 2021-09-11 NOTE — Telephone Encounter (Signed)
Pt r/c.

## 2021-09-15 ENCOUNTER — Ambulatory Visit: Payer: BC Managed Care – PPO | Admitting: Nurse Practitioner

## 2021-09-15 ENCOUNTER — Ambulatory Visit (INDEPENDENT_AMBULATORY_CARE_PROVIDER_SITE_OTHER): Payer: BC Managed Care – PPO

## 2021-09-15 ENCOUNTER — Encounter: Payer: Self-pay | Admitting: Nurse Practitioner

## 2021-09-15 VITALS — BP 119/76 | HR 92 | Temp 98.3°F | Resp 20 | Ht 62.0 in | Wt 277.0 lb

## 2021-09-15 DIAGNOSIS — M5430 Sciatica, unspecified side: Secondary | ICD-10-CM

## 2021-09-15 DIAGNOSIS — M549 Dorsalgia, unspecified: Secondary | ICD-10-CM

## 2021-09-15 MED ORDER — KETOROLAC TROMETHAMINE 30 MG/ML IJ SOLN
30.0000 mg | Freq: Once | INTRAMUSCULAR | Status: AC
  Administered 2021-09-15: 30 mg via INTRAMUSCULAR

## 2021-09-15 NOTE — Progress Notes (Signed)
Acute Office Visit  Subjective:     Patient ID: Ruth Rivers, female    DOB: 10-04-1975, 46 y.o.   MRN: [id number]  Chief Complaint  Patient presents with   Back Pain    Back Pain This is a chronic problem. The problem occurs constantly. The problem is unchanged. The pain is present in the lumbar spine. The pain is at a severity of 7/10. The pain is severe. Stiffness is present All day. Associated symptoms include bladder incontinence. Pertinent negatives include no fever. She has tried muscle relaxant for the symptoms. The treatment provided no relief.    Review of Systems  Constitutional: Negative.  Negative for chills and fever.  HENT: Negative.    Eyes: Negative.   Respiratory: Negative.    Cardiovascular: Negative.   Genitourinary:  Positive for bladder incontinence.  Musculoskeletal:  Positive for back pain.  Skin: Negative.  Negative for rash.  All other systems reviewed and are negative.       Objective:    BP 119/76   Pulse 92   Temp 98.3 F (36.8 C) (Temporal)   Resp 20   Ht '5\' 2"'$  (1.575 m)   Wt 277 lb (125.6 kg)   LMP 06/16/2018 Comment: states no way pregnant  SpO2 95%   BMI 50.66 kg/m  BP Readings from Last 3 Encounters:  09/15/21 119/76  09/05/21 107/76  06/19/21 104/74   Wt Readings from Last 3 Encounters:  09/15/21 277 lb (125.6 kg)  09/05/21 277 lb 6.4 oz (125.8 kg)  06/19/21 282 lb (127.9 kg)      Physical Exam Vitals and nursing note reviewed.  Constitutional:      Appearance: She is obese.  HENT:     Head: Normocephalic.     Right Ear: External ear normal.     Left Ear: External ear normal.     Nose: Nose normal.     Mouth/Throat:     Mouth: Mucous membranes are moist.     Pharynx: Oropharynx is clear.  Eyes:     Conjunctiva/sclera: Conjunctivae normal.  Cardiovascular:     Rate and Rhythm: Normal rate and regular rhythm.     Pulses: Normal pulses.     Heart sounds: Normal heart sounds.  Pulmonary:     Effort: Pulmonary  effort is normal.     Breath sounds: Normal breath sounds.  Abdominal:     General: Bowel sounds are normal.  Skin:    Findings: No erythema or rash.  Neurological:     General: No focal deficit present.     Mental Status: She is alert and oriented to person, place, and time.     No results found for any visits on 09/15/21.      Assessment & Plan:  Back pain uncontrolled.  Patient is reporting new symptoms of incontinence.  30 mg Toradol given in clinic, continue Robaxin as prescribed, referral completed to orthopedic.  Patient knows to go to emergency department with worsening or unresolved symptoms after office hours. Problem List Items Addressed This Visit   None Visit Diagnoses     Back pain with sciatica    -  Primary   Relevant Medications   ketorolac (TORADOL) 30 MG/ML injection 30 mg (Completed) (Start on 09/15/2021  4:45 PM)   Other Relevant Orders   DG Lumbar Spine 2-3 Views   AMB referral to orthopedics       Meds ordered this encounter  Medications   ketorolac (TORADOL) 30 MG/ML injection 30  mg    No follow-ups on file.  Ivy Lynn, NP

## 2021-09-19 ENCOUNTER — Other Ambulatory Visit: Payer: Self-pay | Admitting: Nurse Practitioner

## 2021-09-19 DIAGNOSIS — M5430 Sciatica, unspecified side: Secondary | ICD-10-CM

## 2021-09-28 ENCOUNTER — Ambulatory Visit: Payer: Self-pay | Admitting: Physical Medicine and Rehabilitation

## 2021-10-02 ENCOUNTER — Other Ambulatory Visit: Payer: Self-pay | Admitting: Family Medicine

## 2021-10-02 DIAGNOSIS — Z1231 Encounter for screening mammogram for malignant neoplasm of breast: Secondary | ICD-10-CM

## 2021-10-03 ENCOUNTER — Ambulatory Visit (INDEPENDENT_AMBULATORY_CARE_PROVIDER_SITE_OTHER): Payer: BC Managed Care – PPO | Admitting: Physical Medicine and Rehabilitation

## 2021-10-03 ENCOUNTER — Encounter: Payer: Self-pay | Admitting: Physical Medicine and Rehabilitation

## 2021-10-03 VITALS — BP 114/82 | HR 94

## 2021-10-03 DIAGNOSIS — M47816 Spondylosis without myelopathy or radiculopathy, lumbar region: Secondary | ICD-10-CM | POA: Diagnosis not present

## 2021-10-03 DIAGNOSIS — G8929 Other chronic pain: Secondary | ICD-10-CM | POA: Diagnosis not present

## 2021-10-03 DIAGNOSIS — M5442 Lumbago with sciatica, left side: Secondary | ICD-10-CM | POA: Diagnosis not present

## 2021-10-03 DIAGNOSIS — M4726 Other spondylosis with radiculopathy, lumbar region: Secondary | ICD-10-CM | POA: Diagnosis not present

## 2021-10-03 DIAGNOSIS — M5416 Radiculopathy, lumbar region: Secondary | ICD-10-CM | POA: Diagnosis not present

## 2021-10-03 DIAGNOSIS — M8938 Hypertrophy of bone, other site: Secondary | ICD-10-CM

## 2021-10-03 NOTE — Progress Notes (Unsigned)
Ruth Rivers - 46 y.o. female MRN [id number]  Date of birth: 04-Jan-1976  Office Visit Note: Visit Date: 10/03/2021 PCP: Dettinger, Fransisca Kaufmann, MD Referred by: Ivy Lynn, NP  Subjective: Chief Complaint  Patient presents with   Lower Back - Pain   HPI: Ruth Rivers is a 46 y.o. female who comes in today per the request of Jac Canavan, NP for evaluation of chronic, worsening and severe left sided lower back pain radiating down anterior leg to foot. Pain ongoing for several years, has become worse over the last 3 weeks. Pain is exacerbated by sitting and laying, most severe pain at night when trying to sleep. She describes pain as sharp and burning in nature, currently rates as 8 out of 10. Some relief of pain with home exercise regimen, rest and use of medications. Patient also states pain eases when she is up walking and moving. Recent lumbar CT imaging exhibits multilevel facet hypertrophy, there is epidural lipomatosis without significant spinal canal stenosis at L5-S1, moderate left foraminal stenosis also noted at this level. No high grade spinal canal stenosis noted. Patient has history of both left L3-L4 and left L4-L5 interlaminar epidural steroid injections performed at [facility] in 2019, total of 3 injections and reports significant and sustained pain relief until recently. Patient has no history of lumbar surgery. 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 Patient denies focal weakness, numbness and tingling. Patient denies recent trauma or falls.    Oswestry Disability Index Score 24% 10 to 20 (40%) moderate disability: The patient experiences more pain and difficulty with sitting, lifting and standing. Travel and social life are more difficult, and they may be disabled from work. Personal care, sexual activity and sleeping are not grossly affected, and the patient can usually be managed by conservative means.  Review of Systems   Musculoskeletal:  Positive for back pain.  Neurological:  Negative for tingling, sensory change, focal weakness and weakness.  All other systems reviewed and are negative.  Otherwise per HPI.  Assessment & Plan: Visit Diagnoses:    ICD-10-CM   1. Chronic left-sided low back pain with left-sided sciatica  M54.42 Ambulatory referral to Physical Medicine Rehab   G89.29     2. Lumbar radiculopathy  M54.16 Ambulatory referral to Physical Medicine Rehab    3. Other spondylosis with radiculopathy, lumbar region  M47.26 Ambulatory referral to Physical Medicine Rehab    4. Facet hypertrophy of lumbar region  M47.816 Ambulatory referral to Physical Medicine Rehab       Plan: Findings:  Chronic, worsening and severe left sided lower back pain radiating down anterior leg to foot.  Patient continues to have severe pain despite good conservative therapy such as home exercise regimen, rest and use of medications.  Patient's clinical presentation and exam are consistent with left L4/L5 nerve pattern. Next step is to perform diagnostic and hopefully therapeutic left L4-L5 interlaminar epidural steroid injection under fluoroscopic guidance. She is not currently taking anticoagulant medications. I did discuss injection procedure with patient today in detail, she has no questions at this time. If patient gets good relief with injection we can repeat infrequently as needed.  If her pain persists post injection we would look at regrouping with our in-house physical therapy team and/or obtaining lumbar MRI imaging. No red flag symptoms noted upon exam today.     Meds & Orders: No orders of the defined types were placed in this encounter.   Orders Placed  This Encounter  Procedures   Ambulatory referral to Physical Medicine Rehab    Follow-up: Return for Left L4-L5 interlaminar epidural steroid injection.   Procedures: No procedures performed      Clinical History: No specialty comments available.    She reports that she has never smoked. She has never used smokeless tobacco. No results for input(s): "HGBA1C", "LABURIC" in the last 8760 hours.  Objective:  VS:  HT:    WT:   BMI:     BP:114/82  HR:94bpm  TEMP: ( )  RESP:  Physical Exam Vitals and nursing note reviewed.  HENT:     Head: Normocephalic and atraumatic.     Right Ear: External ear normal.     Left Ear: External ear normal.     Nose: Nose normal.     Mouth/Throat:     Mouth: Mucous membranes are moist.  Eyes:     Extraocular Movements: Extraocular movements intact.  Cardiovascular:     Rate and Rhythm: Normal rate.     Pulses: Normal pulses.  Pulmonary:     Effort: Pulmonary effort is normal.  Abdominal:     General: Abdomen is flat. There is no distension.  Musculoskeletal:        General: Tenderness present.     Cervical back: Normal range of motion.     Comments: Pt rises from seated position to standing without difficulty. Good lumbar range of motion. Strong distal strength without clonus, no pain upon palpation of greater trochanters. Dysesthesias noted to left L4/L5 dermatomes. Sensation intact bilaterally. Walks independently, gait steady.   Skin:    General: Skin is warm and dry.     Capillary Refill: Capillary refill takes less than 2 seconds.  Neurological:     General: No focal deficit present.     Mental Status: She is alert and oriented to person, place, and time.  Psychiatric:        Mood and Affect: Mood normal.        Behavior: Behavior normal.     Ortho Exam  Imaging: No results found.  Past Medical/Family/Surgical/Social History: Medications & Allergies reviewed per EMR, new medications updated. Patient Active Problem List   Diagnosis Date Noted   Dyslipidemia 06/19/2021   Seasonal allergic rhinitis 09/26/2018   PUD (peptic ulcer disease) 05/03/2015   Insomnia 02/03/2015   Gastritis 01/02/2012   Hx of adenomatous colonic polyps 01/02/2012   Anxiety and depression 11/23/2011    Obesity 11/23/2011   Past Medical History:  Diagnosis Date   Adenomatous colon polyp    Allergy    seasonal   Anxiety    Cellulitis    Cough    had a cough for 2 weeks/ in isolation for over 2 weeks/ no fever/ in March of 2020   Depression    Gastritis    History of herniated intervertebral disc    Insomnia    Obesity    Post-operative nausea and vomiting    Family History  Problem Relation Age of Onset   Colon polyps Mother    Colon polyps Father    Colon cancer Maternal Aunt        dx 49's died within 1 year   Breast cancer Paternal Grandmother    Other Cousin        Non-Hodgkins lymphoma   Breast cancer Other        PGGM   Esophageal cancer Neg Hx    Rectal cancer Neg Hx    Stomach cancer  Neg Hx    Pancreatic cancer Neg Hx    Past Surgical History:  Procedure Laterality Date   BREAST REDUCTION SURGERY  2001   CESAREAN SECTION     one time   CHOLECYSTECTOMY  10/1998   COLONOSCOPY  2013   COLONOSCOPY W/ BIOPSIES     POLYPECTOMY  2013   TONSILLECTOMY     Social History   Occupational History   Occupation: Product manager: [employer]  Tobacco Use   Smoking status: Never   Smokeless tobacco: Never  Vaping Use   Vaping Use: Never used  Substance and Sexual Activity   Alcohol use: Yes    Alcohol/week: 0.0 standard drinks of alcohol    Comment: occasional   Drug use: No   Sexual activity: Not on file

## 2021-10-03 NOTE — Progress Notes (Unsigned)
Lbp with left leg pain for 3 weeks. No know injury. Constant sharpe pain. Tingling down left leg, especially in foot. Pcp tried prednisone and ibu and ER tried naproxen. Tried heat and ice. Worse with sitting or standing. Walking helps pain  Numeric Pain Rating Scale and Functional Assessment Average Pain 7   In the last MONTH (on 0-10 scale) has pain interfered with the following?  1. General activity like being  able to carry out your everyday physical activities such as walking, climbing stairs, carrying groceries, or moving a chair?  Rating(5)

## 2021-10-04 ENCOUNTER — Ambulatory Visit
Admission: RE | Admit: 2021-10-04 | Discharge: 2021-10-04 | Disposition: A | Discharge status: Home / Self Care | Payer: Self-pay | Source: Ambulatory Visit | Attending: Family Medicine | Admitting: Family Medicine

## 2021-10-04 DIAGNOSIS — Z1231 Encounter for screening mammogram for malignant neoplasm of breast: Secondary | ICD-10-CM

## 2021-10-06 ENCOUNTER — Telehealth: Payer: Self-pay | Admitting: Physical Medicine and Rehabilitation

## 2021-10-06 NOTE — Telephone Encounter (Signed)
Patient has questions about her visit on the 19 with Dr Ernestina Patches.  [phone]

## 2021-10-06 NOTE — Telephone Encounter (Signed)
Patient wanted to know if there is anything she can be prescribed for the pain she is experiencing. [facility].

## 2021-10-09 ENCOUNTER — Other Ambulatory Visit: Payer: Self-pay | Admitting: Physical Medicine and Rehabilitation

## 2021-10-09 MED ORDER — TRAMADOL HCL 50 MG PO TABS: 50.0000 mg | ORAL_TABLET | Freq: Three times a day (TID) | ORAL | 0 refills | Status: DC | PRN

## 2021-10-09 NOTE — Telephone Encounter (Signed)
Pt.notified

## 2021-10-16 ENCOUNTER — Encounter: Payer: Self-pay | Admitting: Physician Assistant

## 2021-10-19 ENCOUNTER — Ambulatory Visit: Payer: BC Managed Care – PPO | Admitting: Physical Medicine and Rehabilitation

## 2021-10-19 ENCOUNTER — Ambulatory Visit: Payer: Self-pay

## 2021-10-19 VITALS — BP 109/76 | HR 86

## 2021-10-19 DIAGNOSIS — M5416 Radiculopathy, lumbar region: Secondary | ICD-10-CM | POA: Diagnosis not present

## 2021-10-19 MED ORDER — METHYLPREDNISOLONE ACETATE 80 MG/ML IJ SUSP
80.0000 mg | Freq: Once | INTRAMUSCULAR | Status: AC
  Administered 2021-10-19: 80 mg

## 2021-10-19 NOTE — Progress Notes (Signed)
Numeric Pain Rating Scale and Functional Assessment Average Pain 7   In the last MONTH (on 0-10 scale) has pain interfered with the following?  1. General activity like being  able to carry out your everyday physical activities such as walking, climbing stairs, carrying groceries, or moving a chair?  Rating(9)   +Driver, -BT, -Dye Allergies.   Today, the pain level is a 7.

## 2021-10-19 NOTE — Patient Instructions (Signed)

## 2021-10-20 ENCOUNTER — Other Ambulatory Visit: Payer: Self-pay | Admitting: Family Medicine

## 2021-10-20 DIAGNOSIS — Z7184 Encounter for health counseling related to travel: Secondary | ICD-10-CM

## 2021-10-21 ENCOUNTER — Other Ambulatory Visit: Payer: Self-pay | Admitting: Family Medicine

## 2021-10-21 DIAGNOSIS — M1711 Unilateral primary osteoarthritis, right knee: Secondary | ICD-10-CM

## 2021-10-30 NOTE — Procedures (Signed)
Lumbar Epidural Steroid Injection - Interlaminar Approach with Fluoroscopic Guidance  Patient: Ruth Rivers      Date of Birth: 1975/06/24 MRN: [id number] PCP: Dettinger, Fransisca Kaufmann, MD      Visit Date: 10/19/2021   Universal Protocol:     Consent Given By: the patient  Position: PRONE  Additional Comments: Vital signs were monitored before and after the procedure. Patient was prepped and draped in the usual sterile fashion. The correct patient, procedure, and site was verified.   Injection Procedure Details:   Procedure diagnoses: Lumbar radiculopathy [M54.16]   Meds Administered:  Meds ordered this encounter  Medications   methylPREDNISolone acetate (DEPO-MEDROL) injection 80 mg     Laterality: Left  Location/Site:  L4-5  Needle: 6.0 in., 20 ga. Tuohy  Needle Placement: Paramedian epidural  Findings:   -Comments: Excellent flow of contrast into the epidural space.  Procedure Details: Using a paramedian approach from the side mentioned above, the region overlying the inferior lamina was localized under fluoroscopic visualization and the soft tissues overlying this structure were infiltrated with 4 ml. of 1% Lidocaine without Epinephrine. The Tuohy needle was inserted into the epidural space using a paramedian approach.   The epidural space was localized using loss of resistance along with counter oblique bi-planar fluoroscopic views.  After negative aspirate for air, blood, and CSF, a 2 ml. volume of Isovue-250 was injected into the epidural space and the flow of contrast was observed. Radiographs were obtained for documentation purposes.    The injectate was administered into the level noted above.   Additional Comments:  The patient tolerated the procedure well Dressing: 2 x 2 sterile gauze and Band-Aid    Post-procedure details: Patient was observed during the procedure. Post-procedure instructions were reviewed.  Patient left the clinic in stable  condition.

## 2021-10-30 NOTE — Progress Notes (Signed)
Ruth Rivers - 46 y.o. female MRN [id number]  Date of birth: Apr 21, 1975  Office Visit Note: Visit Date: 10/19/2021 PCP: Dettinger, Fransisca Kaufmann, MD Referred by: Dettinger, Fransisca Kaufmann, MD  Subjective: Chief Complaint  Patient presents with   Lower Back - Pain   HPI:  Ruth Rivers is a 46 y.o. female who comes in today at the request of Barnet Pall, FNP for planned Left L4-5 Lumbar Interlaminar epidural steroid injection with fluoroscopic guidance.  The patient has failed conservative care including home exercise, medications, time and activity modification.  This injection will be diagnostic and hopefully therapeutic.  Please see requesting physician notes for further details and justification.   ROS Otherwise per HPI.  Assessment & Plan: Visit Diagnoses:    ICD-10-CM   1. Lumbar radiculopathy  M54.16 XR C-ARM NO REPORT    Epidural Steroid injection    methylPREDNISolone acetate (DEPO-MEDROL) injection 80 mg      Plan: No additional findings.   Meds & Orders:  Meds ordered this encounter  Medications   methylPREDNISolone acetate (DEPO-MEDROL) injection 80 mg    Orders Placed This Encounter  Procedures   XR C-ARM NO REPORT   Epidural Steroid injection    Follow-up: Return for visit to requesting provider as needed.   Procedures: No procedures performed  Lumbar Epidural Steroid Injection - Interlaminar Approach with Fluoroscopic Guidance  Patient: Ruth Rivers      Date of Birth: [birth year] MRN: [id number] PCP: Dettinger, Fransisca Kaufmann, MD      Visit Date: 10/19/2021   Universal Protocol:     Consent Given By: the patient  Position: PRONE  Additional Comments: Vital signs were monitored before and after the procedure. Patient was prepped and draped in the usual sterile fashion. The correct patient, procedure, and site was verified.   Injection Procedure Details:   Procedure diagnoses: Lumbar radiculopathy [M54.16]   Meds Administered:  Meds ordered this  encounter  Medications   methylPREDNISolone acetate (DEPO-MEDROL) injection 80 mg     Laterality: Left  Location/Site:  L4-5  Needle: 6.0 in., 20 ga. Tuohy  Needle Placement: Paramedian epidural  Findings:   -Comments: Excellent flow of contrast into the epidural space.  Procedure Details: Using a paramedian approach from the side mentioned above, the region overlying the inferior lamina was localized under fluoroscopic visualization and the soft tissues overlying this structure were infiltrated with 4 ml. of 1% Lidocaine without Epinephrine. The Tuohy needle was inserted into the epidural space using a paramedian approach.   The epidural space was localized using loss of resistance along with counter oblique bi-planar fluoroscopic views.  After negative aspirate for air, blood, and CSF, a 2 ml. volume of Isovue-250 was injected into the epidural space and the flow of contrast was observed. Radiographs were obtained for documentation purposes.    The injectate was administered into the level noted above.   Additional Comments:  The patient tolerated the procedure well Dressing: 2 x 2 sterile gauze and Band-Aid    Post-procedure details: Patient was observed during the procedure. Post-procedure instructions were reviewed.  Patient left the clinic in stable condition.   Clinical History: No specialty comments available.     Objective:  VS:  HT:    WT:   BMI:     BP:109/76  HR:86bpm  TEMP: ( )  RESP:98 % Physical Exam Vitals and nursing note reviewed.  Constitutional:      General: She is not in acute distress.    Appearance: Normal appearance.  She is obese. She is not ill-appearing.  HENT:     Head: Normocephalic and atraumatic.     Right Ear: External ear normal.     Left Ear: External ear normal.  Eyes:     Extraocular Movements: Extraocular movements intact.  Cardiovascular:     Rate and Rhythm: Normal rate.     Pulses: Normal pulses.  Pulmonary:      Effort: Pulmonary effort is normal. No respiratory distress.  Abdominal:     General: There is no distension.     Palpations: Abdomen is soft.  Musculoskeletal:        General: Tenderness present.     Cervical back: Neck supple.     Right lower leg: No edema.     Left lower leg: No edema.     Comments: Patient has good distal strength with no pain over the greater trochanters.  No clonus or focal weakness.  Skin:    Findings: No erythema, lesion or rash.  Neurological:     General: No focal deficit present.     Mental Status: She is alert and oriented to person, place, and time.     Sensory: No sensory deficit.     Motor: No weakness or abnormal muscle tone.     Coordination: Coordination normal.  Psychiatric:        Mood and Affect: Mood normal.        Behavior: Behavior normal.      Imaging: No results found.

## 2021-11-10 ENCOUNTER — Ambulatory Visit: Payer: BC Managed Care – PPO | Admitting: Family Medicine

## 2021-11-10 ENCOUNTER — Encounter: Payer: Self-pay | Admitting: Family Medicine

## 2021-11-10 VITALS — HR 74 | Temp 97.6°F | Ht 62.0 in | Wt 270.0 lb

## 2021-11-10 DIAGNOSIS — G4709 Other insomnia: Secondary | ICD-10-CM

## 2021-11-10 DIAGNOSIS — F32A Depression, unspecified: Secondary | ICD-10-CM

## 2021-11-10 DIAGNOSIS — E785 Hyperlipidemia, unspecified: Secondary | ICD-10-CM | POA: Diagnosis not present

## 2021-11-10 DIAGNOSIS — F419 Anxiety disorder, unspecified: Secondary | ICD-10-CM

## 2021-11-10 MED ORDER — ZOLPIDEM TARTRATE ER 6.25 MG PO TBCR: 6.2500 mg | EXTENDED_RELEASE_TABLET | Freq: Every evening | ORAL | 5 refills | Status: DC | PRN

## 2021-11-10 NOTE — Progress Notes (Signed)
Pulse 74   Temp 97.6 F (36.4 C)   Ht '5\' 2"'$  (1.575 m)   Wt 270 lb (122.5 kg)   LMP 06/16/2018 Comment: states no way pregnant  SpO2 98%   BMI 49.38 kg/m    Subjective:   Patient ID: Ruth Rivers, female    DOB: 1975-02-20, 46 y.o.   MRN: [id number]  HPI: Ruth Rivers is a 46 y.o. female presenting on 11/10/2021 for Medical Management of Chronic Issues (4  month )   HPI insomnia Current rx- ambien 6.25 mg 1-2 tabs qhs prn # meds rx- 60 Effectiveness of current meds-works well, no issues Adverse reactions form meds-none  Pill count performed-No Last drug screen -12/30/2020 ( high risk q25m moderate risk q666mlow risk yearly ) Urine drug screen today- No [place]  If yes were their any concerning findings? -None  No flowsheet data found.   Controlled substance contract signed on: 12/30/2020  Relevant past medical, surgical, family and social history reviewed and updated as indicated. Interim medical history since our last visit reviewed. Allergies and medications reviewed and updated.  Review of Systems  Constitutional:  Negative for chills and fever.  Eyes:  Negative for redness and visual disturbance.  Respiratory:  Negative for chest tightness and shortness of breath.   Cardiovascular:  Negative for chest pain and leg swelling.  Skin:  Negative for rash.  Neurological:  Negative for light-headedness and headaches.  Psychiatric/Behavioral:  Positive for sleep disturbance. Negative for agitation, behavioral problems, self-injury and suicidal ideas.   All other systems reviewed and are negative.   Per HPI unless specifically indicated above   Allergies as of 11/10/2021       Reactions   Cephalosporins Itching, Rash   Penicillins Rash        Medication List        Accurate as of November 10, 2021  4:07 PM. If you have any questions, ask your nurse or doctor.          buPROPion 300 MG 24 hr tablet Commonly known as: WELLBUTRIN  XL daily.   calcium-vitamin D 500-200 MG-UNIT tablet Commonly known as: OSCAL WITH D Take 1 tablet by mouth daily.   clonazePAM 1 MG tablet Commonly known as: KLONOPIN Take 0.5 mg by mouth as needed.   diclofenac 75 MG EC tablet Commonly known as: VOLTAREN TAKE 1 TABLET BY MOUTH TWICE A DAY   diclofenac Sodium 1 % Gel Commonly known as: Voltaren Apply 2 g topically 4 (four) times daily.   scopolamine 1 MG/3DAYS Commonly known as: TRANSDERM-SCOP PLACE 1 PATCH ONTO THE SKIN EVERY 3 DAYS.   traZODone 50 MG tablet Commonly known as: DESYREL Take 50 mg by mouth at bedtime. 2 -3 tablets at bedtime   vortioxetine HBr 10 MG Tabs tablet Commonly known as: TRINTELLIX Take 5 mg by mouth 2 (two) times daily.   zolpidem 6.25 MG CR tablet Commonly known as: AMBIEN CR Take 1-2 tablets (6.25-12.5 mg total) by mouth at bedtime as needed for sleep. Cancel out any prior prescriptions on file What changed: additional instructions Changed by: JoFransisca Kaufmannettinger, MD         Objective:   Pulse 74   Temp 97.6 F (36.4 C)   Ht '5\' 2"'$  (1.575 m)   Wt 270 lb (122.5 kg)   LMP 06/16/2018 Comment: states no way pregnant  SpO2 98%   BMI 49.38 kg/m   Wt Readings from Last 3 Encounters:  11/10/21 270  lb (122.5 kg)  09/15/21 277 lb (125.6 kg)  09/05/21 277 lb 6.4 oz (125.8 kg)    Physical Exam Vitals and nursing note reviewed.  Constitutional:      General: She is not in acute distress.    Appearance: She is well-developed. She is not diaphoretic.  Eyes:     Conjunctiva/sclera: Conjunctivae normal.  Cardiovascular:     Rate and Rhythm: Normal rate and regular rhythm.     Heart sounds: Normal heart sounds. No murmur heard. Pulmonary:     Effort: Pulmonary effort is normal. No respiratory distress.     Breath sounds: Normal breath sounds. No wheezing.  Musculoskeletal:        General: No tenderness. Normal range of motion.  Skin:    General: Skin is warm and dry.     Findings: No  rash.  Neurological:     Mental Status: She is alert and oriented to person, place, and time.     Coordination: Coordination normal.  Psychiatric:        Behavior: Behavior normal.       Assessment & Plan:   Problem List Items Addressed This Visit       Other   Anxiety and depression - Primary   Insomnia   Relevant Medications   zolpidem (AMBIEN CR) 6.25 MG CR tablet   Dyslipidemia    Continue Ambien, will see back in 6 months for blood work Follow up plan: Return in about 6 months (around 05/12/2022), or if symptoms worsen or fail to improve, for Insomnia recheck blood work.  Counseling provided for all of the vaccine components No orders of the defined types were placed in this encounter.   [name], MD [facility] 11/10/2021, 4:07 PM

## 2021-11-15 ENCOUNTER — Encounter: Payer: Self-pay | Admitting: Physician Assistant

## 2021-11-15 ENCOUNTER — Ambulatory Visit: Payer: BC Managed Care – PPO | Admitting: Physician Assistant

## 2021-11-15 VITALS — BP 108/64 | HR 80 | Ht 61.5 in | Wt 270.4 lb

## 2021-11-15 DIAGNOSIS — Z86010 Personal history of colonic polyps: Secondary | ICD-10-CM | POA: Diagnosis not present

## 2021-11-15 DIAGNOSIS — K449 Diaphragmatic hernia without obstruction or gangrene: Secondary | ICD-10-CM

## 2021-11-15 DIAGNOSIS — Z7189 Other specified counseling: Secondary | ICD-10-CM | POA: Diagnosis not present

## 2021-11-15 NOTE — Patient Instructions (Addendum)
_______________________________________________________  If you are age 48 or older, your body mass index should be between 23-30. Your Body mass index is 50.26 kg/m. If this is out of the aforementioned range listed, please consider follow up with your Primary Care Provider.  If you are age 80 or younger, your body mass index should be between 19-25. Your Body mass index is 50.26 kg/m. If this is out of the aformentioned range listed, please consider follow up with your Primary Care Provider.   You have been scheduled for an endoscopy. Please follow written instructions given to you at your visit today. If you use inhalers (even only as needed), please bring them with you on the day of your procedure.    The [facility] providers would like to encourage you to use [facility] to communicate with providers for non-urgent requests or questions.  Due to long hold times on the telephone, sending your provider a message by [facility] may be a faster and more efficient way to get a response.  Please allow 48 business hours for a response.  Please remember that this is for non-urgent requests.   It was a pleasure to see you today!  Thank you for trusting me with your gastrointestinal care!    Ellouise Newer, PA-C

## 2021-11-15 NOTE — Progress Notes (Signed)
I agree with the assessment and plan as outlined by Ms. Lemmon. 

## 2021-11-15 NOTE — Progress Notes (Signed)
Chief Complaint: Discussion of EGD prior to bariatric surgery  HPI:    Ruth Rivers is a 46 year old Caucasian female, known to Dr. Hilarie Fredrickson, with a past medical history as listed below including obesity, who was referred to me by Torrie Mayers, F* for discussion of EGD prior to bariatric surgery.    12/03/2011 EGD with class B esophagitis, moderate gastritis and normal duodenum.  Colonoscopy at same time with 2 sessile polyps and mild erythema in the sigmoid colon.  Pathology showed normal small bowel mucosa, gastritis without H. pylori or metaplasia and sessile serrated adenomas in the colon.  Mild erythema in the sigmoid colon was benign without evidence of chronic inflammation.  Repeat colon recommended in 3 years.    01/02/2012 patient seen in clinic by Dr. Hilarie Fredrickson for follow-up of weight loss, nausea and abdominal pain.At that time started on twice daily Nexium and Sucralfate.  She was doing better.    07/10/2018 colonoscopy with one 7 mm polyp in the descending colon otherwise normal.  Pathology showed tubular adenoma and repeat recommended in 5 years.    09/28/2021 patient had a meeting with general surgery about bariatric surgery.  They recommended a letter of support from our clinic.    Today, the patient tells me that she is just gearing up to have bariatric surgery and was told that she needed an EGD prior to having this happen.  She would like to be scheduled for one today.  She also asked about whether or not her colonoscopy is due.  She is not having any GI issues or complaints.    Denies fever, chills, weight loss, blood in her stool, change in bowel habits or abdominal pain.  Past Medical History:  Diagnosis Date   Adenomatous colon polyp    Allergy    seasonal   Anxiety    Cellulitis    Cough    had a cough for 2 weeks/ in isolation for over 2 weeks/ no fever/ in March of 2020   Depression    Gastritis    History of herniated intervertebral disc    Insomnia    Obesity     Post-operative nausea and vomiting     Past Surgical History:  Procedure Laterality Date   BREAST REDUCTION SURGERY  2001   CESAREAN SECTION     one time   CHOLECYSTECTOMY  10/1998   COLONOSCOPY  2013   COLONOSCOPY W/ BIOPSIES     POLYPECTOMY  2013   TONSILLECTOMY      Current Outpatient Medications  Medication Sig Dispense Refill   buPROPion (WELLBUTRIN XL) 300 MG 24 hr tablet daily.      calcium-vitamin D (OSCAL WITH D) 500-200 MG-UNIT tablet Take 1 tablet by mouth daily.     clonazePAM (KLONOPIN) 1 MG tablet Take 0.5 mg by mouth as needed.      diclofenac (VOLTAREN) 75 MG EC tablet TAKE 1 TABLET BY MOUTH TWICE A DAY 60 tablet 2   diclofenac Sodium (VOLTAREN) 1 % GEL Apply 2 g topically 4 (four) times daily. 350 g 2   scopolamine (TRANSDERM-SCOP) 1 MG/3DAYS PLACE 1 PATCH ONTO THE SKIN EVERY 3 DAYS. 10 patch 1   traZODone (DESYREL) 50 MG tablet Take 50 mg by mouth at bedtime. 2 -3 tablets at bedtime     vortioxetine HBr (TRINTELLIX) 10 MG TABS tablet Take 5 mg by mouth 2 (two) times daily.      zolpidem (AMBIEN CR) 6.25 MG CR tablet Take 1-2 tablets (  6.25-12.5 mg total) by mouth at bedtime as needed for sleep. Cancel out any prior prescriptions on file 60 tablet 5   No current facility-administered medications for this visit.    Allergies as of 11/15/2021 - Review Complete 11/10/2021  Allergen Reaction Noted   Cephalosporins Itching and Rash 02/03/2015   Penicillins Rash 10/02/2011    Family History  Problem Relation Age of Onset   Colon polyps Mother    Colon polyps Father    Colon cancer Maternal Aunt        dx 8's died within 1 year   Breast cancer Paternal Grandmother    Other Cousin        Non-Hodgkins lymphoma   Breast cancer Other        PGGM   Esophageal cancer Neg Hx    Rectal cancer Neg Hx    Stomach cancer Neg Hx    Pancreatic cancer Neg Hx     Social History   Socioeconomic History   Marital status: Married    Spouse name: Not on file    Number of children: 1   Years of education: Not on file   Highest education level: Not on file  Occupational History   Occupation: Product manager: [employer]  Tobacco Use   Smoking status: Never   Smokeless tobacco: Never  Vaping Use   Vaping Use: Never used  Substance and Sexual Activity   Alcohol use: Yes    Alcohol/week: 0.0 standard drinks of alcohol    Comment: occasional   Drug use: No   Sexual activity: Not on file  Other Topics Concern   Not on file  Social History Narrative   Not on file   Social Determinants of Health   Financial Resource Strain: Not on file  Food Insecurity: No Food Insecurity (12/21/2019)   Hunger Vital Sign    Worried About Running Out of Food in the Last Year: Never true    Ran Out of Food in the Last Year: Never true  Transportation Needs: Not on file  Physical Activity: Not on file  Stress: Not on file  Social Connections: Not on file  Intimate Partner Violence: Not on file    Review of Systems:    Constitutional: No weight loss, fever or chills Skin: No rash  Cardiovascular: No chest pain Respiratory: No SOB  Gastrointestinal: See HPI and otherwise negative Genitourinary: No dysuria  Neurological: No headache, dizziness or syncope Musculoskeletal: No new muscle or joint pain Hematologic: No bleeding  Psychiatric: No history of depression or anxiety   Physical Exam:  Vital signs: BP 108/64 (BP Location: Left Arm, Patient Position: Sitting, Cuff Size: Large)   Pulse 80   Ht 5' 1.5" (1.562 m) Comment: height measured without shoes  Wt 270 lb 6 oz (122.6 kg)   LMP 06/16/2018 Comment: states no way pregnant  BMI 50.26 kg/m    Constitutional:   Pleasant obese Caucasian female appears to be in NAD, Well developed, Well nourished, alert and cooperative Head:  Normocephalic and atraumatic. Eyes:   PEERL, EOMI. No icterus. Conjunctiva pink. Ears:  Normal auditory acuity. Neck:  Supple Throat: Oral cavity and  pharynx without inflammation, swelling or lesion.  Respiratory: Respirations even and unlabored. Lungs clear to auscultation bilaterally.   No wheezes, crackles, or rhonchi.  Cardiovascular: Normal S1, S2. No MRG. Regular rate and rhythm. No peripheral edema, cyanosis or pallor.  Gastrointestinal:  Soft, nondistended, nontender. No rebound or guarding. Normal bowel sounds.  No appreciable masses or hepatomegaly. Rectal:  Not performed.  Msk:  Symmetrical without gross deformities. Without edema, no deformity or joint abnormality.  Neurologic:  Alert and  oriented x4;  grossly normal neurologically.  Skin:   Dry and intact without significant lesions or rashes. Psychiatric: Demonstrates good judgement and reason without abnormal affect or behaviors.  RELEVANT LABS AND IMAGING: CBC    Component Value Date/Time   WBC 8.7 06/19/2021 1629   WBC 9.9 11/18/2012 1733   WBC 10.8 (H) 11/23/2011 1419   RBC 4.34 06/19/2021 1629   RBC 4.5 11/18/2012 1733   RBC 4.89 11/23/2011 1419   HGB 13.4 06/19/2021 1629   HCT 40.4 06/19/2021 1629   PLT 258 06/19/2021 1629   MCV 93 06/19/2021 1629   MCH 30.9 06/19/2021 1629   MCH 30.4 11/18/2012 1733   MCHC 33.2 06/19/2021 1629   MCHC 34.6 11/18/2012 1733   MCHC 33.1 11/23/2011 1419   RDW 12.0 06/19/2021 1629   LYMPHSABS 2.4 06/19/2021 1629   EOSABS 0.1 06/19/2021 1629   BASOSABS 0.1 06/19/2021 1629    CMP     Component Value Date/Time   NA 138 06/19/2021 1629   K 4.2 06/19/2021 1629   CL 101 06/19/2021 1629   CO2 25 06/19/2021 1629   GLUCOSE 83 06/19/2021 1629   GLUCOSE 83 11/23/2011 1419   BUN 17 06/19/2021 1629   CREATININE 0.84 06/19/2021 1629   CALCIUM 8.3 (L) 06/19/2021 1629   PROT 6.2 06/19/2021 1629   ALBUMIN 3.8 06/19/2021 1629   AST 11 06/19/2021 1629   ALT 17 06/19/2021 1629   ALKPHOS 60 06/19/2021 1629   BILITOT <0.2 06/19/2021 1629   GFRNONAA 93 11/17/2015 1618   GFRAA 107 11/17/2015 1618    Assessment: 1.  Planned  bariatric surgery: Sting EGD prior to procedure 2.  History of hiatal hernia: On last EGD in 2018 3.  History of adenomatous polyp: Repeat colonoscopy due in June 2025  Plan: 1.  Scheduled patient for an EGD at the hospital given her BMI greater than 50.  We will see which provider has the first available appointment and schedule her with them.  This was Dr. Lorenso Courier. Did discuss that she could start her weight loss process and come back in to be reweighed to see if she drops below 50 but she does not want to do this.  She just wants to be scheduled at the hospital.  Did provide the patient with a detailed list of risks for the procedure and she agrees to proceed. 2.  Patient to follow in clinic per recommendations after procedure above.  She is not due for her next colonoscopy until 2025.  Ellouise Newer, PA-C [facility] 11/15/2021, 2:35 PM  Cc: Torrie Mayers, F*

## 2021-11-27 NOTE — Progress Notes (Signed)
Addendum: Reviewed and agree with assessment and management plan. Dinesha Twiggs M, MD  

## 2021-12-09 ENCOUNTER — Other Ambulatory Visit: Payer: Self-pay | Admitting: Family Medicine

## 2021-12-09 DIAGNOSIS — M5430 Sciatica, unspecified side: Secondary | ICD-10-CM

## 2021-12-27 ENCOUNTER — Encounter (HOSPITAL_COMMUNITY): Payer: Self-pay | Admitting: Internal Medicine

## 2021-12-27 NOTE — Progress Notes (Signed)
Attempted to obtain medical history via telephone, unable to reach at this time. HIPAA compliant voicemail message left requesting return call to pre surgical testing department. 

## 2021-12-31 ENCOUNTER — Other Ambulatory Visit: Payer: Self-pay | Admitting: Family Medicine

## 2021-12-31 DIAGNOSIS — G4709 Other insomnia: Secondary | ICD-10-CM

## 2022-01-03 ENCOUNTER — Encounter (HOSPITAL_COMMUNITY): Payer: Self-pay | Admitting: Certified Registered"

## 2022-01-03 ENCOUNTER — Telehealth: Payer: Self-pay

## 2022-01-03 NOTE — Telephone Encounter (Signed)
Pt called and spoke with [facility] endo unit, has resp symptoms. Per anesthesia pts procedure should be cancelled and rescheduled in about 3 weeks. Proecedure cancelled. Will look for next available slot with Dr. Norman Herrlich.

## 2022-01-04 ENCOUNTER — Ambulatory Visit (HOSPITAL_COMMUNITY)
Admission: RE | Admit: 2022-01-04 | Payer: BC Managed Care – PPO | Source: Home / Self Care | Admitting: Internal Medicine

## 2022-01-04 SURGERY — ESOPHAGOGASTRODUODENOSCOPY (EGD) WITH PROPOFOL
Anesthesia: Monitor Anesthesia Care

## 2022-02-05 NOTE — Telephone Encounter (Signed)
Left message to call back. Trying to reschedule her EGD [phone] at [facility] for March 27, 2022.  Have not been able to reach pt. Letter mailed to pt requesting she contact the office to reschedule her hospital procedure.

## 2022-05-14 ENCOUNTER — Encounter: Payer: Self-pay | Admitting: Family Medicine

## 2022-05-14 ENCOUNTER — Ambulatory Visit: Payer: BC Managed Care – PPO | Admitting: Family Medicine

## 2022-05-14 VITALS — BP 102/68 | HR 77 | Temp 97.9°F | Resp 20 | Ht 61.5 in | Wt 280.0 lb

## 2022-05-14 DIAGNOSIS — G4709 Other insomnia: Secondary | ICD-10-CM

## 2022-05-14 DIAGNOSIS — F419 Anxiety disorder, unspecified: Secondary | ICD-10-CM

## 2022-05-14 DIAGNOSIS — F32A Depression, unspecified: Secondary | ICD-10-CM | POA: Diagnosis not present

## 2022-05-14 DIAGNOSIS — E785 Hyperlipidemia, unspecified: Secondary | ICD-10-CM | POA: Diagnosis not present

## 2022-05-14 MED ORDER — ZOLPIDEM TARTRATE ER 6.25 MG PO TBCR: 6.2500 mg | EXTENDED_RELEASE_TABLET | Freq: Every evening | ORAL | 5 refills | Status: DC | PRN

## 2022-05-14 NOTE — Progress Notes (Signed)
BP 102/68   Pulse 77   Temp 97.9 F (36.6 C) (Oral)   Resp 20   Ht 5' 1.5" (1.562 m)   Wt 280 lb (127 kg)   LMP 06/16/2018 Comment: states no way pregnant  SpO2 95%   BMI 52.05 kg/m    Subjective:   Patient ID: Ruth Rivers, female    DOB: 23-Jul-1975, 47 y.o.   MRN: [id number]  HPI: Ruth Rivers is a 47 y.o. female presenting on 05/14/2022 for Medical Management of Chronic Issues   HPI Insomnia and anxiety and depression recheck Current rx-Ambien 6.25 mg, take 1-2 nightly as needed, also sees psychiatry for anxiety depression # meds rx-60/month Effectiveness of current meds-works well Adverse reactions form meds-none  Pill count performed-No Last drug screen -12/31/2018 ( high risk q62m, moderate risk q21m, low risk yearly ) Urine drug screen today- Yes Was the NCCSR reviewed-yes  If yes were their any concerning findings? -None  Controlled substance contract signed on: Today  Hyperlipidemia Patient is coming in for recheck of his hyperlipidemia. The patient is currently taking diet control rather than the medicine. They deny any issues with myalgias or history of liver damage from it. They deny any focal numbness or weakness or chest pain.   Relevant past medical, surgical, family and social history reviewed and updated as indicated. Interim medical history since our last visit reviewed. Allergies and medications reviewed and updated.  Review of Systems  Constitutional:  Negative for chills and fever.  HENT:  Negative for congestion, ear discharge and ear pain.   Eyes:  Negative for redness and visual disturbance.  Respiratory:  Negative for chest tightness and shortness of breath.   Cardiovascular:  Negative for chest pain and leg swelling.  Genitourinary:  Negative for difficulty urinating and dysuria.  Musculoskeletal:  Negative for back pain and gait problem.  Skin:  Negative for rash.  Neurological:  Negative for dizziness, light-headedness and headaches.   Psychiatric/Behavioral:  Positive for dysphoric mood and sleep disturbance. Negative for agitation, behavioral problems, self-injury and suicidal ideas. The patient is nervous/anxious.   All other systems reviewed and are negative.   Per HPI unless specifically indicated above   Allergies as of 05/14/2022       Reactions   Cephalosporins Itching, Rash   Penicillins Rash        Medication List        Accurate as of May 14, 2022  3:52 PM. If you have any questions, ask your nurse or doctor.          STOP taking these medications    diphenhydrAMINE 25 MG tablet Commonly known as: BENADRYL Stopped by: Elige Radon Obelia Bonello, MD       TAKE these medications    buPROPion 300 MG 24 hr tablet Commonly known as: WELLBUTRIN XL Take 300 mg by mouth daily.   calcium-vitamin D 500-200 MG-UNIT tablet Commonly known as: OSCAL WITH D Take 1 tablet by mouth daily.   clonazePAM 0.5 MG tablet Commonly known as: KLONOPIN Take 0.25-0.5 mg by mouth 2 (two) times daily as needed for anxiety.   diclofenac 75 MG EC tablet Commonly known as: VOLTAREN TAKE 1 TABLET BY MOUTH TWICE A DAY   diclofenac Sodium 1 % Gel Commonly known as: Voltaren Apply 2 g topically 4 (four) times daily. What changed:  when to take this reasons to take this   Multi-Vitamin tablet Take 1 tablet by mouth daily.   traZODone 100 MG tablet Commonly known as:  DESYREL Take 200-300 mg by mouth at bedtime.   vortioxetine HBr 20 MG Tabs tablet Commonly known as: TRINTELLIX Take 20 mg by mouth daily.   Vraylar 6 MG Caps Generic drug: Cariprazine HCl Take 6 mg by mouth daily.   zolpidem 6.25 MG CR tablet Commonly known as: AMBIEN CR Take 1-2 tablets (6.25-12.5 mg total) by mouth at bedtime as needed for sleep. Cancel out any prior prescriptions on file         Objective:   BP 102/68   Pulse 77   Temp 97.9 F (36.6 C) (Oral)   Resp 20   Ht 5' 1.5" (1.562 m)   Wt 280 lb (127 kg)   LMP  06/16/2018 Comment: states no way pregnant  SpO2 95%   BMI 52.05 kg/m   Wt Readings from Last 3 Encounters:  05/14/22 280 lb (127 kg)  11/15/21 270 lb 6 oz (122.6 kg)  11/10/21 270 lb (122.5 kg)    Physical Exam Vitals and nursing note reviewed.  Constitutional:      General: She is not in acute distress.    Appearance: She is well-developed. She is not diaphoretic.  Eyes:     Conjunctiva/sclera: Conjunctivae normal.  Cardiovascular:     Rate and Rhythm: Normal rate and regular rhythm.     Heart sounds: Normal heart sounds. No murmur heard. Pulmonary:     Effort: Pulmonary effort is normal. No respiratory distress.     Breath sounds: Normal breath sounds. No wheezing.  Musculoskeletal:        General: No swelling. Normal range of motion.  Skin:    General: Skin is warm and dry.     Findings: No rash.  Neurological:     Mental Status: She is alert and oriented to person, place, and time.     Coordination: Coordination normal.  Psychiatric:        Behavior: Behavior normal.       Assessment & Plan:   Problem List Items Addressed This Visit       Other   Anxiety and depression   Relevant Orders   CBC with Differential/Platelet   ToxASSURE Select 13 (MW), Urine   TSH   Insomnia   Relevant Medications   zolpidem (AMBIEN CR) 6.25 MG CR tablet   Other Relevant Orders   CBC with Differential/Platelet   ToxASSURE Select 13 (MW), Urine   TSH   Dyslipidemia - Primary   Relevant Orders   CBC with Differential/Platelet   CMP14+EGFR   Lipid panel    Continue current medicine, seems to be doing well, blood pressure looks decent today 2.  No changes Follow up plan: Return in about 6 months (around 11/13/2022), or if symptoms worsen or fail to improve, for Insomnia and dyslipidemia recheck.  Counseling provided for all of the vaccine components Orders Placed This Encounter  Procedures   CBC with Differential/Platelet   CMP14+EGFR   Lipid panel   ToxASSURE Select  13 (MW), Urine   TSH    Arville Care, MD [place] [facility] 05/14/2022, 3:52 PM

## 2022-05-15 LAB — CBC WITH DIFFERENTIAL/PLATELET
Basophils Absolute: 0.1 10*3/uL (ref 0.0–0.2)
Basos: 1 %
EOS (ABSOLUTE): 0.2 10*3/uL (ref 0.0–0.4)
Eos: 2 %
Hematocrit: 41.6 % (ref 34.0–46.6)
Hemoglobin: 13.4 g/dL (ref 11.1–15.9)
Immature Grans (Abs): 0.1 10*3/uL (ref 0.0–0.1)
Immature Granulocytes: 1 %
Lymphocytes Absolute: 2.6 10*3/uL (ref 0.7–3.1)
Lymphs: 28 %
MCH: 29.7 pg (ref 26.6–33.0)
MCHC: 32.2 g/dL (ref 31.5–35.7)
MCV: 92 fL (ref 79–97)
Monocytes Absolute: 0.9 10*3/uL (ref 0.1–0.9)
Monocytes: 9 %
Neutrophils Absolute: 5.7 10*3/uL (ref 1.4–7.0)
Neutrophils: 59 %
Platelets: 282 10*3/uL (ref 150–450)
RBC: 4.51 x10E6/uL (ref 3.77–5.28)
RDW: 12.4 % (ref 11.7–15.4)
WBC: 9.5 10*3/uL (ref 3.4–10.8)

## 2022-05-15 LAB — CMP14+EGFR
ALT: 21 IU/L (ref 0–32)
AST: 17 IU/L (ref 0–40)
Albumin/Globulin Ratio: 1.6 (ref 1.2–2.2)
Albumin: 4.1 g/dL (ref 3.9–4.9)
Alkaline Phosphatase: 82 IU/L (ref 44–121)
BUN/Creatinine Ratio: 16 (ref 9–23)
BUN: 14 mg/dL (ref 6–24)
Bilirubin Total: <0.2 mg/dL (ref 0.0–1.2)
CO2: 21 mmol/L (ref 20–29)
Calcium: 8.9 mg/dL (ref 8.7–10.2)
Chloride: 100 mmol/L (ref 96–106)
Creatinine, Ser: 0.85 mg/dL (ref 0.57–1.00)
Globulin, Total: 2.6 g/dL (ref 1.5–4.5)
Glucose: 82 mg/dL (ref 70–99)
Potassium: 4.3 mmol/L (ref 3.5–5.2)
Sodium: 139 mmol/L (ref 134–144)
Total Protein: 6.7 g/dL (ref 6.0–8.5)
eGFR: 84 mL/min/{1.73_m2} (ref >59)

## 2022-05-15 LAB — LIPID PANEL
Chol/HDL Ratio: 3.3 ratio (ref 0.0–4.4)
Cholesterol, Total: 193 mg/dL (ref 100–199)
HDL: 58 mg/dL (ref >39)
LDL Chol Calc (NIH): 109 mg/dL — ABNORMAL HIGH (ref 0–99)
Triglycerides: 147 mg/dL (ref 0–149)
VLDL Cholesterol Cal: 26 mg/dL (ref 5–40)

## 2022-05-15 LAB — TSH: TSH: 3.11 u[IU]/mL (ref 0.450–4.500)

## 2022-05-16 LAB — TOXASSURE SELECT 13 (MW), URINE

## 2022-05-17 ENCOUNTER — Telehealth: Payer: Self-pay | Admitting: Family Medicine

## 2022-05-17 NOTE — Telephone Encounter (Signed)
I do not see a MyChart message, she tried to send 1 to me but I do not see it, please ask her to resend the MyChart message or to let us know what she would like.

## 2022-05-17 NOTE — Telephone Encounter (Signed)
Information on another patient in pts last office visit note.  Dr. Louanne Skye, I am not sure which part of the note she is speaking of. Please review.

## 2022-05-17 NOTE — Telephone Encounter (Signed)
Patient wants to speak to Dr. Louanne Skye directly, when asked what it was about, she said it was private and she did not want to give any details other than it was something in her MyChart.

## 2022-05-17 NOTE — Telephone Encounter (Signed)
Returned call to patient she states that she goes in her my chart to appt details and pulls up her AVS. Initially it begins talking about her but then it starts using female pronouns.  I have reviewed the AVS and do not see what the patient is speaking of. Pt states that it does not state another pts name. Advised to please disregard.

## 2022-06-04 ENCOUNTER — Telehealth (INDEPENDENT_AMBULATORY_CARE_PROVIDER_SITE_OTHER): Payer: BC Managed Care – PPO | Admitting: Family Medicine

## 2022-06-04 ENCOUNTER — Encounter: Payer: Self-pay | Admitting: Family Medicine

## 2022-06-04 DIAGNOSIS — M549 Dorsalgia, unspecified: Secondary | ICD-10-CM

## 2022-06-04 DIAGNOSIS — M5430 Sciatica, unspecified side: Secondary | ICD-10-CM | POA: Diagnosis not present

## 2022-06-04 MED ORDER — PREDNISONE 20 MG PO TABS: 40.0000 mg | ORAL_TABLET | Freq: Every day | ORAL | 0 refills | Status: AC

## 2022-06-04 NOTE — Progress Notes (Signed)
Virtual Visit via Video   I connected with patient on 06/04/22 at 1313 by a video enabled telemedicine application and verified that I am speaking with the correct person using two identifiers.  Location patient: Home Location provider: [facility] Persons participating in the virtual visit: Patient and Provider  I discussed the limitations of evaluation and management by telemedicine and the availability of in person appointments. The patient expressed understanding and agreed to proceed.  Subjective:   HPI:  Pt presents today for  Chief Complaint  Patient presents with   Back Pain   Recurrent back pain with radiation to left leg. Has seen neurosurgery in the past and would like to return.   Back Pain This is a recurrent problem. The current episode started in the past 7 days. The problem has been waxing and waning since onset. The pain is present in the lumbar spine. The pain radiates to the left thigh and left knee. The pain is moderate. The symptoms are aggravated by position, standing, stress, twisting and bending. Associated symptoms include leg pain and paresthesias. Pertinent negatives include no abdominal pain, bladder incontinence, bowel incontinence, chest pain, dysuria, fever, headaches, numbness, paresis, pelvic pain, perianal numbness, tingling, weakness or weight loss. She has tried muscle relaxant and NSAIDs for the symptoms. The treatment provided no relief.     Review of Systems  Constitutional:  Negative for chills, diaphoresis, fever, malaise/fatigue and weight loss.  Cardiovascular:  Negative for chest pain.  Gastrointestinal:  Negative for abdominal pain and bowel incontinence.  Genitourinary:  Negative for bladder incontinence, dysuria, flank pain, frequency, hematuria, pelvic pain and urgency.  Musculoskeletal:  Positive for back pain. Negative for falls, joint pain, myalgias and neck pain.  Neurological:  Positive for  paresthesias. Negative for tingling, weakness, numbness and headaches.  All other systems reviewed and are negative.    Patient Active Problem List   Diagnosis Date Noted   Dyslipidemia 06/19/2021   Seasonal allergic rhinitis 09/26/2018   PUD (peptic ulcer disease) 05/03/2015   Insomnia 02/03/2015   Gastritis 01/02/2012   Hx of adenomatous colonic polyps 01/02/2012   Anxiety and depression 11/23/2011   Obesity 11/23/2011    Social History   Tobacco Use   Smoking status: Never   Smokeless tobacco: Never  Substance Use Topics   Alcohol use: Not Currently    Current Outpatient Medications:    predniSONE (DELTASONE) 20 MG tablet, Take 2 tablets (40 mg total) by mouth daily with breakfast for 5 days. 2 po daily for 5 days, Disp: 10 tablet, Rfl: 0   buPROPion (WELLBUTRIN XL) 300 MG 24 hr tablet, Take 300 mg by mouth daily., Disp: , Rfl:    calcium-vitamin D (OSCAL WITH D) 500-200 MG-UNIT tablet, Take 1 tablet by mouth daily., Disp: , Rfl:    clonazePAM (KLONOPIN) 0.5 MG tablet, Take 0.25-0.5 mg by mouth 2 (two) times daily as needed for anxiety., Disp: , Rfl:    diclofenac (VOLTAREN) 75 MG EC tablet, TAKE 1 TABLET BY MOUTH TWICE A DAY, Disp: 60 tablet, Rfl: 2   diclofenac Sodium (VOLTAREN) 1 % GEL, Apply 2 g topically 4 (four) times daily. (Patient taking differently: Apply 2 g topically 3 (three) times daily as needed (pain).), Disp: 350 g, Rfl: 2   Multiple Vitamin (MULTI-VITAMIN) tablet, Take 1 tablet by mouth daily., Disp: , Rfl:    traZODone (DESYREL) 100 MG tablet, Take 200-300 mg by mouth at bedtime., Disp: , Rfl:    vortioxetine  HBr (TRINTELLIX) 20 MG TABS tablet, Take 20 mg by mouth daily., Disp: , Rfl:    VRAYLAR 6 MG CAPS, Take 6 mg by mouth daily., Disp: , Rfl:    zolpidem (AMBIEN CR) 6.25 MG CR tablet, Take 1-2 tablets (6.25-12.5 mg total) by mouth at bedtime as needed for sleep. Cancel out any prior prescriptions on file, Disp: 60 tablet, Rfl: 5  Allergies  Allergen  Reactions   Cephalosporins Itching and Rash   Penicillins Rash    Objective:   LMP 06/16/2018 Comment: states no way pregnant  Patient is well-developed, well-nourished in no acute distress.  Resting comfortably at home.  Head is normocephalic, atraumatic.  No labored breathing.  Speech is clear and coherent with logical content.  Patient is alert and oriented at baseline.   Assessment and Plan:   Ruth Rivers was seen today for back pain.  Diagnoses and all orders for this visit:  Back pain with sciatica No red flags concerning for cauda equina syndrome. Will burst with steroids and refer back to ortho. Pt aware of red flags which require emergent evaluation and treatment. Report new, worsening, or persistent symptoms.  -     predniSONE (DELTASONE) 20 MG tablet; Take 2 tablets (40 mg total) by mouth daily with breakfast for 5 days. 2 po daily for 5 days -     Ambulatory referral to Orthopedic Surgery      Return if symptoms worsen or fail to improve.  Kari Baars, FNP-C [facility] [address] [place], [place] [address] [phone]  06/04/2022  Time spent with the patient: 12 minutes, of which >50% was spent in obtaining information about symptoms, reviewing previous labs, evaluations, and treatments, counseling about condition (please see the discussed topics above), and developing a plan to further investigate it; had a number of questions which I addressed.

## 2022-06-05 ENCOUNTER — Ambulatory Visit: Payer: BC Managed Care – PPO | Admitting: Physical Medicine and Rehabilitation

## 2022-06-05 ENCOUNTER — Encounter: Payer: Self-pay | Admitting: Physical Medicine and Rehabilitation

## 2022-06-05 DIAGNOSIS — M47816 Spondylosis without myelopathy or radiculopathy, lumbar region: Secondary | ICD-10-CM

## 2022-06-05 DIAGNOSIS — G8929 Other chronic pain: Secondary | ICD-10-CM

## 2022-06-05 DIAGNOSIS — M5416 Radiculopathy, lumbar region: Secondary | ICD-10-CM

## 2022-06-05 DIAGNOSIS — M5442 Lumbago with sciatica, left side: Secondary | ICD-10-CM | POA: Diagnosis not present

## 2022-06-05 MED ORDER — TRAMADOL HCL 50 MG PO TABS: 50.0000 mg | ORAL_TABLET | Freq: Three times a day (TID) | ORAL | 0 refills | Status: DC | PRN

## 2022-06-05 NOTE — Progress Notes (Signed)
Ruth Rivers - 47 y.o. female MRN [id number]  Date of birth: Dec 20, 1975  Office Visit Note: Visit Date: 06/05/2022 PCP: Dettinger, Elige Radon, MD Referred by: Dettinger, Elige Radon, MD  Subjective: Chief Complaint  Patient presents with   Lower Back - Pain   HPI: Ruth Rivers is a 47 y.o. female who comes in today per the request of Kari Baars, NP for evaluation of chronic, worsening and severe left sided lower back pain radiating down to buttock and left lateral leg to foot. Pain ongoing for several months, worsened 2 weeks ago. Pain becomes severe with prolonged sitting. Movement and activity seem to help alleviate pain. She describes pain as throbbing and sore sensation, currently rates as 6 out of 10. Lumbar CT imaging from 2023 exhibits multilevel facet hypertrophy, there is epidural lipomatosis without significant spinal canal stenosis at L5-S1, moderate left foraminal stenosis also noted at the level of L5. History of both left L3-L4 and left L4-L5 interlaminar epidural steroid injections performed at [facility] in 2019, total of 3 injections and reports significant and sustained pain relief with this procedure. Patient underwent left L4-L5 interlaminar epidural steroid injection in our office on 10/19/2021, she reports significant relief of pain greater than 50% for more than 3 months with this procedure. Patient currently employed as Runner, broadcasting/film/video. Patient denies focal weakness, numbness and tingling. No recent trauma or falls.   Patients course is complicated by anxiety, depression and morbid obesity.    Review of Systems  Musculoskeletal:  Positive for back pain.  Neurological:  Negative for tingling, sensory change, focal weakness and weakness.  All other systems reviewed and are negative.  Otherwise per HPI.  Assessment & Plan: Visit Diagnoses:    ICD-10-CM   1. Chronic left-sided low back pain with left-sided sciatica  M54.42 Ambulatory referral to Physical Medicine Rehab    G89.29     2. Lumbar radiculopathy  M54.16 Ambulatory referral to Physical Medicine Rehab    3. Facet hypertrophy of lumbar region  M47.816 Ambulatory referral to Physical Medicine Rehab       Plan: Findings:  Chronic, worsening and severe left sided lower back pain radiating to buttock down lateral leg to foot. Patient continues to have severe pain despite good conservative therapies such as home exercise regimen, rest and use of medications. Patients clinical presentation and exam are consistent with L5 nerve pattern. Next step is to perform diagnostic and hopefully therapeutic left L5-S1 interlaminar epidural steroid injection under fluoroscopic guidance. If good relief of pain we can repeat this procedure infrequently as needed. I discussed medication management with her today, she appears to be significantly uncomfortable in the office today. I gave her intramuscular injection of Toradol in the office today, she tolerated well. I also prescribed short course of Tramadol for her to take while we are waiting on insurance approval for injection. Patient has no questions regarding injection procedure at this time. No red flag symptoms noted upon exam today.     Meds & Orders:  Meds ordered this encounter  Medications   traMADol (ULTRAM) 50 MG tablet    Sig: Take 1 tablet (50 mg total) by mouth every 8 (eight) hours as needed.    Dispense:  20 tablet    Refill:  0    Orders Placed This Encounter  Procedures   Small Joint Inj   Ambulatory referral to Physical Medicine Rehab    Follow-up: Return for Left L5-S1 interlaminar epidural steroid injection.   Procedures: Small Joint Inj  on 06/06/2022 8:10 AM Indications: pain Details: 25 G needle, posterior approach Medications: 30 mg ketorolac 30 MG/ML Outcome: tolerated well, no immediate complications  Intramuscular injection of Toradol to left ventrogluteal region.  Consent was given by the patient. Immediately prior to procedure a time  out was called to verify the correct patient, procedure, equipment, support staff and site/side marked as required. Patient was prepped and draped in the usual sterile fashion.          Clinical History: EXAM:  CT LUMBAR SPINE WITHOUT CONTRAST   TECHNIQUE:  Multidetector CT imaging of the lumbar spine was performed without  intravenous contrast administration. Multiplanar CT image  reconstructions were also generated.   RADIATION DOSE REDUCTION: This exam was performed according to the  departmental dose-optimization program which includes automated  exposure control, adjustment of the mA and/or kV according to  patient size and/or use of iterative reconstruction technique.   COMPARISON:  Report of lumbar MRI 09/13/2017 (no images available).   FINDINGS:  Segmentation: Normal.   Alignment: Maintained lumbar lordosis. Subtle levoconvex lumbar  scoliosis, apex at L3. No significant spondylolisthesis.   Vertebrae: No acute osseous abnormality identified. Visible sacrum  and SI joints appear intact.   Paraspinal and other soft tissues: No hydronephrosis or hydroureter.  Bladder not included. Negative visible noncontrast bowel, kidneys,  liver.   Lumbar paraspinal soft tissues are within normal limits.   Disc levels:   T12-L1:  Negative.   L1-L2:  Negative.   L2-L3:  Mild disc bulging.  No stenosis.   L3-L4: Mild disc bulging eccentric to the right. Mild to moderate  facet hypertrophy. Right side vacuum facet. No significant spinal  stenosis. Mild if any L3 neural foraminal stenosis.   L4-L5: Disc space loss with vacuum disc. Circumferential disc bulge  eccentric to the right. Mild to moderate facet and ligament flavum  hypertrophy. No significant spinal stenosis. Mild L4 foraminal  stenosis on the right.   L5-S1: Disc space loss with vacuum disc. Circumferential disc  osteophyte complex eccentric to the left. Moderate facet  hypertrophy. Epidural lipomatosis  without significant spinal  stenosis. Moderate left L5 foraminal stenosis.   IMPRESSION:  1. Lumbar spine degeneration without CT evidence of large disc  herniation or spinal stenosis.  Disc bulging and facet arthropathy. Moderate left L5 foraminal  stenosis.   2. No hydronephrosis or hydroureter.    Electronically Signed    By: Odessa Fleming M.D.    On: 09/16/2021 09:28   She reports that she has never smoked. She has never used smokeless tobacco. No results for input(s): "HGBA1C", "LABURIC" in the last 8760 hours.  Objective:  VS:  HT:    WT:   BMI:     BP:   HR: bpm  TEMP: ( )  RESP:  Physical Exam Vitals and nursing note reviewed.  HENT:     Head: Normocephalic and atraumatic.     Right Ear: External ear normal.     Left Ear: External ear normal.     Nose: Nose normal.     Mouth/Throat:     Mouth: Mucous membranes are moist.  Eyes:     Extraocular Movements: Extraocular movements intact.  Cardiovascular:     Rate and Rhythm: Normal rate.     Pulses: Normal pulses.  Pulmonary:     Effort: Pulmonary effort is normal.  Abdominal:     General: Abdomen is flat. There is no distension.  Musculoskeletal:  General: Tenderness present.     Cervical back: Normal range of motion.     Comments: Patient rises from seated position to standing without difficulty. Good lumbar range of motion. No pain noted with facet loading. 5/5 strength noted with bilateral hip flexion, knee flexion/extension, ankle dorsiflexion/plantarflexion and EHL. No clonus noted bilaterally. No pain upon palpation of greater trochanters. No pain with internal/external rotation of bilateral hips. Sensation intact bilaterally. Dysesthesias noted to left L5 dermatome. Positive slump test on the left. Ambulates without aid, gait steady.     Skin:    General: Skin is warm and dry.     Capillary Refill: Capillary refill takes less than 2 seconds.  Neurological:     General: No focal deficit present.      Mental Status: She is alert and oriented to person, place, and time.  Psychiatric:        Mood and Affect: Mood normal.        Behavior: Behavior normal.     Ortho Exam  Imaging: No results found.  Past Medical/Family/Surgical/Social History: Medications & Allergies reviewed per EMR, new medications updated. Patient Active Problem List   Diagnosis Date Noted   Dyslipidemia 06/19/2021   Seasonal allergic rhinitis 09/26/2018   PUD (peptic ulcer disease) 05/03/2015   Insomnia 02/03/2015   Gastritis 01/02/2012   Hx of adenomatous colonic polyps 01/02/2012   Anxiety and depression 11/23/2011   Obesity 11/23/2011   Past Medical History:  Diagnosis Date   Adenomatous colon polyp    Allergy    seasonal   Anxiety    Cellulitis    Cough    had a cough for 2 weeks/ in isolation for over 2 weeks/ no fever/ in March of 2020   Depression    Gastritis    GERD (gastroesophageal reflux disease)    History of herniated intervertebral disc    Insomnia    Obesity    Post-operative nausea and vomiting    Family History  Problem Relation Age of Onset   Colon polyps Mother    Colon polyps Father    Breast cancer Maternal Grandmother    Colon cancer Maternal Aunt        dx 50's died within 1 year   Other Cousin        Non-Hodgkins lymphoma   Breast cancer Other        PGGM   Autism Son    Schizophrenia Son    Esophageal cancer Neg Hx    Rectal cancer Neg Hx    Stomach cancer Neg Hx    Pancreatic cancer Neg Hx    Past Surgical History:  Procedure Laterality Date   BREAST REDUCTION SURGERY  2001   CESAREAN SECTION     one time   CHOLECYSTECTOMY  10/1998   COLONOSCOPY  2013   COLONOSCOPY W/ BIOPSIES     POLYPECTOMY  2013   TONSILLECTOMY     Social History   Occupational History   Occupation: Magazine features editor: [employer]  Tobacco Use   Smoking status: Never   Smokeless tobacco: Never  Vaping Use   Vaping Use: Never used  Substance and Sexual  Activity   Alcohol use: Not Currently   Drug use: No   Sexual activity: Not on file

## 2022-06-06 DIAGNOSIS — M5442 Lumbago with sciatica, left side: Secondary | ICD-10-CM | POA: Diagnosis not present

## 2022-06-06 DIAGNOSIS — G8929 Other chronic pain: Secondary | ICD-10-CM | POA: Diagnosis not present

## 2022-06-06 DIAGNOSIS — M47816 Spondylosis without myelopathy or radiculopathy, lumbar region: Secondary | ICD-10-CM | POA: Diagnosis not present

## 2022-06-06 DIAGNOSIS — M5416 Radiculopathy, lumbar region: Secondary | ICD-10-CM

## 2022-06-12 ENCOUNTER — Other Ambulatory Visit: Payer: Self-pay | Admitting: Physical Medicine and Rehabilitation

## 2022-06-12 MED ORDER — KETOROLAC TROMETHAMINE 30 MG/ML IJ SOLN
30.0000 mg | INTRAMUSCULAR | Status: AC | PRN
Start: 2022-06-06 — End: 2022-06-06
  Administered 2022-06-06: 30 mg via INTRAMUSCULAR

## 2022-06-13 ENCOUNTER — Other Ambulatory Visit: Payer: Self-pay | Admitting: Physical Medicine and Rehabilitation

## 2022-06-13 ENCOUNTER — Telehealth: Payer: Self-pay | Admitting: Physical Medicine and Rehabilitation

## 2022-06-13 MED ORDER — TRAMADOL HCL 50 MG PO TABS: 50.0000 mg | ORAL_TABLET | Freq: Three times a day (TID) | ORAL | 0 refills | Status: DC | PRN

## 2022-06-13 NOTE — Telephone Encounter (Signed)
Patient advising states her medication is almost out and she wants a refill on Tramadol. Please advise

## 2022-06-19 ENCOUNTER — Other Ambulatory Visit: Payer: Self-pay

## 2022-06-19 ENCOUNTER — Ambulatory Visit: Payer: BC Managed Care – PPO | Admitting: Physical Medicine and Rehabilitation

## 2022-06-19 VITALS — BP 112/73 | HR 106

## 2022-06-19 DIAGNOSIS — M5416 Radiculopathy, lumbar region: Secondary | ICD-10-CM

## 2022-06-19 MED ORDER — METHYLPREDNISOLONE ACETATE 80 MG/ML IJ SUSP
80.0000 mg | Freq: Once | INTRAMUSCULAR | Status: AC
Start: 2022-06-19 — End: 2022-06-19
  Administered 2022-06-19: 80 mg

## 2022-06-19 NOTE — Patient Instructions (Signed)

## 2022-06-19 NOTE — Progress Notes (Unsigned)
Functional Pain Scale - descriptive words and definitions  Distressing (6)    Pain is present/unable to complete most ADLs limited by pain/sleep is difficult and active distraction is only marginal. Moderate range order  Average Pain 5   +Driver, -BT, -Dye Allergies.  Lower back pain on the left side that radiates into the left leg

## 2022-06-20 NOTE — Procedures (Signed)
Lumbar Epidural Steroid Injection - Interlaminar Approach with Fluoroscopic Guidance  Patient: Ruth Rivers      Date of Birth: December 27, 1975 MRN: [id number] PCP: Dettinger, Elige Radon, MD      Visit Date: 06/19/2022   Universal Protocol:     Consent Given By: the patient  Position: PRONE  Additional Comments: Vital signs were monitored before and after the procedure. Patient was prepped and draped in the usual sterile fashion. The correct patient, procedure, and site was verified.   Injection Procedure Details:   Procedure diagnoses: Lumbar radiculopathy [M54.16]   Meds Administered:  Meds ordered this encounter  Medications   methylPREDNISolone acetate (DEPO-MEDROL) injection 80 mg     Laterality: Left  Location/Site:  L5-S1  Needle: 4.5 in., 20 ga. Tuohy  Needle Placement: Paramedian epidural  Findings:   -Comments: Excellent flow of contrast into the epidural space.  Procedure Details: Using a paramedian approach from the side mentioned above, the region overlying the inferior lamina was localized under fluoroscopic visualization and the soft tissues overlying this structure were infiltrated with 4 ml. of 1% Lidocaine without Epinephrine. The Tuohy needle was inserted into the epidural space using a paramedian approach.   The epidural space was localized using loss of resistance along with counter oblique bi-planar fluoroscopic views.  After negative aspirate for air, blood, and CSF, a 2 ml. volume of Isovue-250 was injected into the epidural space and the flow of contrast was observed. Radiographs were obtained for documentation purposes.    The injectate was administered into the level noted above.   Additional Comments:  No complications occurred Dressing: 2 x 2 sterile gauze and Band-Aid    Post-procedure details: Patient was observed during the procedure. Post-procedure instructions were reviewed.  Patient left the clinic in stable condition.

## 2022-06-20 NOTE — Progress Notes (Signed)
Ruth Rivers - 47 y.o. female MRN [id number]  Date of birth: 1975-11-23  Office Visit Note: Visit Date: 06/19/2022 PCP: Dettinger, Elige Radon, MD Referred by: Dettinger, Elige Radon, MD  Subjective: Chief Complaint  Patient presents with   Lower Back - Pain   HPI:  Ruth Rivers is a 47 y.o. female who comes in today at the request of Ellin Goodie, FNP for planned Left L5-S1 Lumbar Interlaminar epidural steroid injection with fluoroscopic guidance.  The patient has failed conservative care including home exercise, medications, time and activity modification.  This injection will be diagnostic and hopefully therapeutic.  Please see requesting physician notes for further details and justification.   ROS Otherwise per HPI.  Assessment & Plan: Visit Diagnoses:    ICD-10-CM   1. Lumbar radiculopathy  M54.16 XR C-ARM NO REPORT    Epidural Steroid injection    methylPREDNISolone acetate (DEPO-MEDROL) injection 80 mg      Plan: No additional findings.   Meds & Orders:  Meds ordered this encounter  Medications   methylPREDNISolone acetate (DEPO-MEDROL) injection 80 mg    Orders Placed This Encounter  Procedures   XR C-ARM NO REPORT   Epidural Steroid injection    Follow-up: Return for visit to requesting provider as needed.   Procedures: No procedures performed  Lumbar Epidural Steroid Injection - Interlaminar Approach with Fluoroscopic Guidance  Patient: Ruth Rivers      Date of Birth: 11/24/1975 MRN: [id number] PCP: Dettinger, Elige Radon, MD      Visit Date: 06/19/2022   Universal Protocol:     Consent Given By: the patient  Position: PRONE  Additional Comments: Vital signs were monitored before and after the procedure. Patient was prepped and draped in the usual sterile fashion. The correct patient, procedure, and site was verified.   Injection Procedure Details:   Procedure diagnoses: Lumbar radiculopathy [M54.16]   Meds Administered:  Meds ordered this  encounter  Medications   methylPREDNISolone acetate (DEPO-MEDROL) injection 80 mg     Laterality: Left  Location/Site:  L5-S1  Needle: 4.5 in., 20 ga. Tuohy  Needle Placement: Paramedian epidural  Findings:   -Comments: Excellent flow of contrast into the epidural space.  Procedure Details: Using a paramedian approach from the side mentioned above, the region overlying the inferior lamina was localized under fluoroscopic visualization and the soft tissues overlying this structure were infiltrated with 4 ml. of 1% Lidocaine without Epinephrine. The Tuohy needle was inserted into the epidural space using a paramedian approach.   The epidural space was localized using loss of resistance along with counter oblique bi-planar fluoroscopic views.  After negative aspirate for air, blood, and CSF, a 2 ml. volume of Isovue-250 was injected into the epidural space and the flow of contrast was observed. Radiographs were obtained for documentation purposes.    The injectate was administered into the level noted above.   Additional Comments:  No complications occurred Dressing: 2 x 2 sterile gauze and Band-Aid    Post-procedure details: Patient was observed during the procedure. Post-procedure instructions were reviewed.  Patient left the clinic in stable condition.   Clinical History: EXAM:  CT LUMBAR SPINE WITHOUT CONTRAST   TECHNIQUE:  Multidetector CT imaging of the lumbar spine was performed without  intravenous contrast administration. Multiplanar CT image  reconstructions were also generated.   RADIATION DOSE REDUCTION: This exam was performed according to the  departmental dose-optimization program which includes automated  exposure control, adjustment of the mA and/or kV according to  patient size and/or use of iterative reconstruction technique.   COMPARISON:  Report of lumbar MRI 09/13/2017 (no images available).   FINDINGS:  Segmentation: Normal.   Alignment:  Maintained lumbar lordosis. Subtle levoconvex lumbar  scoliosis, apex at L3. No significant spondylolisthesis.   Vertebrae: No acute osseous abnormality identified. Visible sacrum  and SI joints appear intact.   Paraspinal and other soft tissues: No hydronephrosis or hydroureter.  Bladder not included. Negative visible noncontrast bowel, kidneys,  liver.   Lumbar paraspinal soft tissues are within normal limits.   Disc levels:   T12-L1:  Negative.   L1-L2:  Negative.   L2-L3:  Mild disc bulging.  No stenosis.   L3-L4: Mild disc bulging eccentric to the right. Mild to moderate  facet hypertrophy. Right side vacuum facet. No significant spinal  stenosis. Mild if any L3 neural foraminal stenosis.   L4-L5: Disc space loss with vacuum disc. Circumferential disc bulge  eccentric to the right. Mild to moderate facet and ligament flavum  hypertrophy. No significant spinal stenosis. Mild L4 foraminal  stenosis on the right.   L5-S1: Disc space loss with vacuum disc. Circumferential disc  osteophyte complex eccentric to the left. Moderate facet  hypertrophy. Epidural lipomatosis without significant spinal  stenosis. Moderate left L5 foraminal stenosis.   IMPRESSION:  1. Lumbar spine degeneration without CT evidence of large disc  herniation or spinal stenosis.  Disc bulging and facet arthropathy. Moderate left L5 foraminal  stenosis.   2. No hydronephrosis or hydroureter.    Electronically Signed    By: Odessa Fleming M.D.    On: 09/16/2021 09:28     Objective:  VS:  HT:    WT:   BMI:     BP:112/73  HR:(!) 106bpm  TEMP: ( )  RESP:  Physical Exam Vitals and nursing note reviewed.  Constitutional:      General: She is not in acute distress.    Appearance: Normal appearance. She is not ill-appearing.  HENT:     Head: Normocephalic and atraumatic.     Right Ear: External ear normal.     Left Ear: External ear normal.  Eyes:     Extraocular Movements: Extraocular movements  intact.  Cardiovascular:     Rate and Rhythm: Normal rate.     Pulses: Normal pulses.  Pulmonary:     Effort: Pulmonary effort is normal. No respiratory distress.  Abdominal:     General: There is no distension.     Palpations: Abdomen is soft.  Musculoskeletal:        General: Tenderness present.     Cervical back: Neck supple.     Right lower leg: No edema.     Left lower leg: No edema.     Comments: Patient has good distal strength with no pain over the greater trochanters.  No clonus or focal weakness.  Skin:    Findings: No erythema, lesion or rash.  Neurological:     General: No focal deficit present.     Mental Status: She is alert and oriented to person, place, and time.     Sensory: No sensory deficit.     Motor: No weakness or abnormal muscle tone.     Coordination: Coordination normal.  Psychiatric:        Mood and Affect: Mood normal.        Behavior: Behavior normal.      Imaging: XR C-ARM NO REPORT  Result Date: 06/19/2022 Please see Notes tab for imaging impression.

## 2022-09-08 ENCOUNTER — Other Ambulatory Visit: Payer: Self-pay | Admitting: Family Medicine

## 2022-09-08 ENCOUNTER — Other Ambulatory Visit: Payer: Self-pay | Admitting: Physical Medicine and Rehabilitation

## 2022-09-08 DIAGNOSIS — M549 Dorsalgia, unspecified: Secondary | ICD-10-CM

## 2022-09-08 DIAGNOSIS — G4709 Other insomnia: Secondary | ICD-10-CM

## 2022-11-19 ENCOUNTER — Ambulatory Visit: Payer: BC Managed Care – PPO | Admitting: Family Medicine

## 2022-11-19 ENCOUNTER — Encounter: Payer: Self-pay | Admitting: Family Medicine

## 2022-11-19 DIAGNOSIS — Z23 Encounter for immunization: Secondary | ICD-10-CM | POA: Diagnosis not present

## 2022-11-19 DIAGNOSIS — G4709 Other insomnia: Secondary | ICD-10-CM

## 2022-11-19 MED ORDER — SEMAGLUTIDE-WEIGHT MANAGEMENT 1 MG/0.5ML ~~LOC~~ SOAJ: 1.0000 mg | SUBCUTANEOUS | 0 refills | Status: AC

## 2022-11-19 MED ORDER — SEMAGLUTIDE-WEIGHT MANAGEMENT 2.4 MG/0.75ML ~~LOC~~ SOAJ: 2.4000 mg | SUBCUTANEOUS | 0 refills | Status: AC

## 2022-11-19 MED ORDER — ZOLPIDEM TARTRATE ER 6.25 MG PO TBCR: 6.2500 mg | EXTENDED_RELEASE_TABLET | Freq: Every evening | ORAL | 5 refills | Status: DC | PRN

## 2022-11-19 MED ORDER — SEMAGLUTIDE-WEIGHT MANAGEMENT 0.25 MG/0.5ML ~~LOC~~ SOAJ: 0.2500 mg | SUBCUTANEOUS | 0 refills | Status: AC

## 2022-11-19 MED ORDER — SEMAGLUTIDE-WEIGHT MANAGEMENT 0.5 MG/0.5ML ~~LOC~~ SOAJ: 0.5000 mg | SUBCUTANEOUS | 0 refills | Status: AC

## 2022-11-19 MED ORDER — SEMAGLUTIDE-WEIGHT MANAGEMENT 1.7 MG/0.75ML ~~LOC~~ SOAJ: 1.7000 mg | SUBCUTANEOUS | 0 refills | Status: AC

## 2022-11-19 NOTE — Progress Notes (Signed)
BP 121/76   Pulse 65   Ht 5' 1.5" (1.562 m)   Wt 268 lb (121.6 kg)   LMP 06/16/2018 Comment: states no way pregnant  SpO2 98%   BMI 49.82 kg/m    Subjective:   Patient ID: Ruth Rivers, female    DOB: 09-May-1975, 47 y.o.   MRN: [id number]  HPI: Chee Dimon is a 47 y.o. female presenting on 11/19/2022 for Weight Management Screening (Wants to lose weight. Diet and exercise not working.)   HPI Weight loss and obesity Patient is coming in today to discuss weight loss and obesity.  She has been trying to do diet and exercise and just feels like she is not doing well with it.  In the past 7 months she has lost 12 pounds but the prior year was about the weight that she is at currently, she was at 270 pounds a year ago and at 280 pounds 7 months ago and at 268 pounds today.  She has been trying LA weight loss diet that she had been trying to watch her meals and intake.  She has been walking every day for at least 30 minutes and she feels like things are just not going down as fast as she would like.  She would like to try other things to help her out.  Relevant past medical, surgical, family and social history reviewed and updated as indicated. Interim medical history since our last visit reviewed. Allergies and medications reviewed and updated.  Review of Systems  Constitutional:  Negative for chills and fever.  Eyes:  Negative for visual disturbance.  Respiratory:  Negative for chest tightness and shortness of breath.   Cardiovascular:  Negative for chest pain and leg swelling.  Genitourinary:  Negative for difficulty urinating and dysuria.  Musculoskeletal:  Negative for back pain and gait problem.  Skin:  Negative for rash.  Neurological:  Negative for dizziness, light-headedness and headaches.  Psychiatric/Behavioral:  Negative for agitation and behavioral problems.   All other systems reviewed and are negative.   Per HPI unless specifically indicated above   Allergies as of  11/19/2022       Reactions   Cephalosporins Itching, Rash   Penicillins Rash        Medication List        Accurate as of November 19, 2022 11:44 AM. If you have any questions, ask your nurse or doctor.          STOP taking these medications    diclofenac 75 MG EC tablet Commonly known as: VOLTAREN Stopped by: Elige Radon Giuseppina Quinones   diclofenac Sodium 1 % Gel Commonly known as: Voltaren Stopped by: Elige Radon Nishanth Mccaughan   traMADol 50 MG tablet Commonly known as: ULTRAM Stopped by: Elige Radon Zebbie Ace       TAKE these medications    buPROPion 300 MG 24 hr tablet Commonly known as: WELLBUTRIN XL Take 300 mg by mouth daily.   calcium-vitamin D 500-200 MG-UNIT tablet Commonly known as: OSCAL WITH D Take 1 tablet by mouth daily.   clonazePAM 0.5 MG tablet Commonly known as: KLONOPIN Take 0.25-0.5 mg by mouth 2 (two) times daily as needed for anxiety.   Multi-Vitamin tablet Take 1 tablet by mouth daily.   Semaglutide-Weight Management 0.25 MG/0.5ML Soaj Inject 0.25 mg into the skin once a week for 28 days. Started by: Elige Radon Kristopher Delk   Semaglutide-Weight Management 0.5 MG/0.5ML Soaj Inject 0.5 mg into the skin once a week for 28 days. Start  taking on: December 18, 2022 Started by: Elige Radon Jovonta Levit   Semaglutide-Weight Management 1 MG/0.5ML Soaj Inject 1 mg into the skin once a week for 28 days. Start taking on: January 16, 2023 Started by: Elige Radon Avon Molock   Semaglutide-Weight Management 1.7 MG/0.75ML Soaj Inject 1.7 mg into the skin once a week for 28 days. Start taking on: February 14, 2023 Started by: Elige Radon Chelcey Caputo   Semaglutide-Weight Management 2.4 MG/0.75ML Soaj Inject 2.4 mg into the skin once a week for 28 days. Start taking on: March 15, 2023 Started by: Elige Radon Kellyn Mccary   traZODone 100 MG tablet Commonly known as: DESYREL Take 200-300 mg by mouth at bedtime.   vortioxetine HBr 20 MG Tabs tablet Commonly known as:  TRINTELLIX Take 20 mg by mouth daily.   Vraylar 6 MG Caps Generic drug: Cariprazine HCl Take 6 mg by mouth daily.   zolpidem 6.25 MG CR tablet Commonly known as: AMBIEN CR Take 1-2 tablets (6.25-12.5 mg total) by mouth at bedtime as needed for sleep. Cancel out any prior prescriptions on file         Objective:   BP 121/76   Pulse 65   Ht 5' 1.5" (1.562 m)   Wt 268 lb (121.6 kg)   LMP 06/16/2018 Comment: states no way pregnant  SpO2 98%   BMI 49.82 kg/m   Wt Readings from Last 3 Encounters:  11/19/22 268 lb (121.6 kg)  05/14/22 280 lb (127 kg)  11/15/21 270 lb 6 oz (122.6 kg)    Physical Exam Vitals and nursing note reviewed.  Constitutional:      General: She is not in acute distress.    Appearance: She is well-developed. She is not diaphoretic.  Eyes:     Conjunctiva/sclera: Conjunctivae normal.  Skin:    General: Skin is warm and dry.     Findings: No rash.  Neurological:     Mental Status: She is alert and oriented to person, place, and time.     Coordination: Coordination normal.  Psychiatric:        Behavior: Behavior normal.       Assessment & Plan:   Problem List Items Addressed This Visit       Other   Insomnia   Relevant Medications   zolpidem (AMBIEN CR) 6.25 MG CR tablet   Other Visit Diagnoses     Morbid obesity (HCC)    -  Primary   Relevant Medications   Semaglutide-Weight Management 0.25 MG/0.5ML SOAJ   Semaglutide-Weight Management 0.5 MG/0.5ML SOAJ (Start on 12/18/2022)   Semaglutide-Weight Management 1 MG/0.5ML SOAJ (Start on 01/16/2023)   Semaglutide-Weight Management 1.7 MG/0.75ML SOAJ (Start on 02/14/2023)   Semaglutide-Weight Management 2.4 MG/0.75ML SOAJ (Start on 03/15/2023)       Patient's BMI is >30 mg/m2.  Patient's current BMI is Body mass index is 49.82 kg/m.Marland Kitchen  Patient is currently enrolled in a healthy eating plan along with encouraged exercise.  Patient has a had issues in the past with elevated blood pressure so the  phentermine is not a great option for her and she is already on Wellbutrin so she cannot take Contrave.  Patient has contraindications to phentermine, Contrave & Qsymia (contains phentermine).  Patient does not have a personal or family history of medullary thyroid carcinoma (MTC) or Multiple Endocrine Neoplasia syndrome type 2 (MEN 2).   Follow up plan: Return in about 3 months (around 02/19/2023), or if symptoms worsen or fail to improve, for Weight loss recheck and  blood work.  Counseling provided for all of the vaccine components No orders of the defined types were placed in this encounter.   Arville Care, MD [facility] 11/19/2022, 11:44 AM

## 2022-11-19 NOTE — Patient Instructions (Signed)
[facility] at [facility] Address: [address] [place], [place] [address] Hours: Open ? Closes 5?PM Phone: [phone]

## 2022-12-04 ENCOUNTER — Other Ambulatory Visit: Payer: Self-pay | Admitting: Family Medicine

## 2022-12-04 DIAGNOSIS — G4709 Other insomnia: Secondary | ICD-10-CM

## 2022-12-10 NOTE — Telephone Encounter (Signed)
Copied from CRM [id number]. Topic: Clinical - Medication Refill >> Dec 10, 2022 10:03 AM Clayton Bibles wrote: Most Recent Primary Care Visit:  Provider: Arville Care A  Department: [facility]  Visit Type: OFFICE VISIT  Date: 11/19/2022  Medication: [facility]olpidem - [facility]  Has the patient contacted their pharmacy? Yes (Agent: If no, request that the patient contact the pharmacy for the refill. If patient does not wish to contact the pharmacy document the reason why and proceed with request.) (Agent: If yes, when and what did the pharmacy advise?)  Is this the correct pharmacy for this prescription? Yes If no, delete pharmacy and type the correct one.  This is the patient's preferred pharmacy:  [id number] [place] [place] - [address] [place] [address] Phone: [phone] Fax: [phone]   Has the prescription been filled recently? No  Is the patient out of the medication? Yes  Has the patient been seen for an appointment in the last year OR does the patient have an upcoming appointment? Yes  Can we respond through MyChart? Yes or call PT if needed   Agent: Please be advised that Rx refills may take up to 3 business days. We ask that you follow-up with your pharmacy.

## 2022-12-25 ENCOUNTER — Other Ambulatory Visit: Payer: Self-pay | Admitting: Family Medicine

## 2022-12-25 DIAGNOSIS — G4709 Other insomnia: Secondary | ICD-10-CM

## 2022-12-25 NOTE — Telephone Encounter (Signed)
Copied from CRM [id number]. Topic: Clinical - Medication Refill >> Dec 25, 2022 10:11 AM Lorin Glass B wrote: Most Recent Primary Care Visit:  Provider: Arville Care A  Department: [facility]  Visit Type: OFFICE VISIT  Date: 11/19/2022  Medication: ***  Has the patient contacted their pharmacy?  (Agent: If no, request that the patient contact the pharmacy for the refill. If patient does not wish to contact the pharmacy document the reason why and proceed with request.) (Agent: If yes, when and what did the pharmacy advise?)  Is this the correct pharmacy for this prescription?  If no, delete pharmacy and type the correct one.  This is the patient's preferred pharmacy:  [phone] [place] [place] - [address] [place] [address] Phone: [phone] Fax: [phone]  [id number] [place], [place] - [address] [facility] [address] [facility] [address] [place] [address] Phone: [phone] Fax: [phone]   Has the prescription been filled recently?   Is the patient out of the medication?   Has the patient been seen for an appointment in the last year OR does the patient have an upcoming appointment?   Can we respond through MyChart?   Agent: Please be advised that Rx refills may take up to 3 business days. We ask that you follow-up with your pharmacy.

## 2022-12-25 NOTE — Telephone Encounter (Signed)
Made pt aware that this will need approval by Dettinger. [facility] [place]. Informed pt that just a one moth supply could be sent. Will speak with Dettinger tomorrow about refill

## 2022-12-26 ENCOUNTER — Other Ambulatory Visit: Payer: Self-pay | Admitting: Family Medicine

## 2022-12-26 DIAGNOSIS — G4709 Other insomnia: Secondary | ICD-10-CM

## 2022-12-26 MED ORDER — ZOLPIDEM TARTRATE ER 6.25 MG PO TBCR: 6.2500 mg | EXTENDED_RELEASE_TABLET | Freq: Every evening | ORAL | 0 refills | Status: DC | PRN

## 2022-12-26 NOTE — Progress Notes (Signed)
[facility] [place]

## 2022-12-26 NOTE — Telephone Encounter (Signed)
Pt aware Dr Dettinger sent in 1 month supply.

## 2022-12-26 NOTE — Progress Notes (Signed)
Pt aware 1 month supply sent in.

## 2023-01-21 ENCOUNTER — Telehealth: Payer: Self-pay | Admitting: Family Medicine

## 2023-01-21 NOTE — Telephone Encounter (Signed)
 Copied from CRM [id number]. Topic: General - Other >> Jan 21, 2023  9:30 AM Mosetta Putt H wrote: Reason for CRM: needs prior authorization for zolpidem (AMBIEN CR) 6.25 MG CR tablet

## 2023-01-22 ENCOUNTER — Telehealth: Payer: Self-pay

## 2023-01-22 ENCOUNTER — Other Ambulatory Visit (HOSPITAL_COMMUNITY): Payer: Self-pay

## 2023-01-22 NOTE — Telephone Encounter (Signed)
 PA request has been Started. New Encounter created for follow up. For additional info see Pharmacy Prior Auth telephone encounter from 01/22/23.

## 2023-01-22 NOTE — Telephone Encounter (Signed)
Clinical questions answered. PA submitted

## 2023-01-22 NOTE — Telephone Encounter (Signed)
 Pharmacy Patient Advocate Encounter  Received notification from [facility] that Prior Authorization for Zolpidem  Tartrate ER 6.25MG  er tablets has been  partially denied.   PA #/Case ID/Reference #: [id number]      . Per test claim: 30 tablets per 30 days is $5

## 2023-01-22 NOTE — Telephone Encounter (Signed)
 Pharmacy Patient Advocate Encounter   Received notification from Pt Calls Messages that prior authorization for Zolpidem  Tartrate ER 6.25MG  er tablets is required/requested.   Insurance verification completed.   The patient is insured through [facility] .   Per test claim: PA required; PA started via CoverMyMeds. KEY [id number] . Waiting for clinical questions to populate.

## 2023-01-23 NOTE — Telephone Encounter (Signed)
 Please let the patient know that the insurance company denied a certain part of this prescription, she should see [place] in the future if she continues to have issues getting this filled.

## 2023-01-23 NOTE — Telephone Encounter (Signed)
 Pt made aware. States that she still has refills on file. She will pay out of pocket for it. Aware to discuss at next appt with Detttinger. She has no further concerns.

## 2023-03-14 ENCOUNTER — Ambulatory Visit: Payer: BC Managed Care – PPO | Admitting: Family Medicine

## 2023-06-05 ENCOUNTER — Encounter: Payer: Self-pay | Admitting: Family Medicine

## 2023-06-05 ENCOUNTER — Ambulatory Visit: Admitting: Family Medicine

## 2023-06-05 VITALS — BP 107/71 | HR 106 | Ht 61.5 in | Wt 269.0 lb

## 2023-06-05 DIAGNOSIS — E669 Obesity, unspecified: Secondary | ICD-10-CM

## 2023-06-05 DIAGNOSIS — E785 Hyperlipidemia, unspecified: Secondary | ICD-10-CM | POA: Diagnosis not present

## 2023-06-05 DIAGNOSIS — M7632 Iliotibial band syndrome, left leg: Secondary | ICD-10-CM

## 2023-06-05 DIAGNOSIS — G4709 Other insomnia: Secondary | ICD-10-CM | POA: Diagnosis not present

## 2023-06-05 LAB — BAYER DCA HB A1C WAIVED: HB A1C (BAYER DCA - WAIVED): 4.8 % (ref 4.8–5.6)

## 2023-06-05 MED ORDER — ZOLPIDEM TARTRATE ER 6.25 MG PO TBCR: 6.2500 mg | EXTENDED_RELEASE_TABLET | Freq: Every evening | ORAL | 5 refills | Status: DC | PRN

## 2023-06-05 NOTE — Progress Notes (Signed)
 BP 107/71   Pulse (!) 106   Ht 5' 1.5" (1.562 m)   Wt 269 lb (122 kg)   LMP 06/16/2018 Comment: states no way pregnant  SpO2 93%   BMI 50.00 kg/m    Subjective:   Patient ID: Ruth Rivers, female    DOB: 1975/02/22, 48 y.o.   MRN: [id number]  HPI: Ruth Rivers is a 48 y.o. female presenting on 06/05/2023 for Medical Management of Chronic Issues, Anxiety, Depression, Insomnia, and Hip Pain (left)   HPI Left lateral hip pain Patient is complaining of left lateral hip pain she says is been bothering her pretty much in the past 6 months.  She says its gotten better and worse but is pretty much been there.  She says it hurts when she is up on her feet for longer periods of time.  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  Insomnia recheck Current rx-Ambien  6.25-12.5 # meds rx- 30 Effectiveness of current meds-works well Adverse reactions form meds-none Pill count performed-No Last drug screen -06/05/2022 ( high risk q81m, moderate risk q44m, low risk yearly ) Urine drug screen today- Yes Was the NCCSR reviewed-yes  If yes were their any concerning findings? -None Controlled substance contract signed on: Today.  Hyperlipidemia and obesity Patient is coming in for recheck of his hyperlipidemia. The patient is currently taking no medication currently. They deny any issues with myalgias or history of liver damage from it. They deny any focal numbness or weakness or chest pain.   Relevant past medical, surgical, family and social history reviewed and updated as indicated. Interim medical history since our last visit reviewed. Allergies and medications reviewed and updated.  Review of Systems  Constitutional:  Negative for chills and fever.  HENT:  Negative for congestion, ear discharge and ear pain.   Eyes:  Negative for redness and visual disturbance.  Respiratory:  Negative for chest tightness and shortness of breath.   Cardiovascular:  Negative for chest pain and leg  swelling.  Genitourinary:  Negative for difficulty urinating and dysuria.  Musculoskeletal:  Negative for back pain and gait problem.  Skin:  Negative for rash.  Neurological:  Negative for dizziness, light-headedness and headaches.  Psychiatric/Behavioral:  Negative for agitation and behavioral problems.   All other systems reviewed and are negative.   Per HPI unless specifically indicated above   Allergies as of 06/05/2023       Reactions   Cephalosporins Itching, Rash   Penicillins Rash        Medication List        Accurate as of Jun 05, 2023  2:50 PM. If you have any questions, ask your nurse or doctor.          buPROPion 300 MG 24 hr tablet Commonly known as: WELLBUTRIN XL Take 300 mg by mouth daily.   calcium -vitamin D  500-200 MG-UNIT tablet Commonly known as: OSCAL WITH D Take 1 tablet by mouth daily.   clonazePAM 0.5 MG tablet Commonly known as: KLONOPIN Take 0.25-0.5 mg by mouth 2 (two) times daily as needed for anxiety.   Multi-Vitamin tablet Take 1 tablet by mouth daily.   traZODone 100 MG tablet Commonly known as: DESYREL Take 200-300 mg by mouth at bedtime.   vortioxetine HBr 20 MG Tabs tablet Commonly known as: TRINTELLIX Take 20 mg by mouth daily.   Vraylar 6 MG Caps Generic drug: Cariprazine HCl Take 6 mg by mouth daily.   zolpidem  6.25 MG CR  tablet Commonly known as: AMBIEN  CR Take 1-2 tablets (6.25-12.5 mg total) by mouth at bedtime as needed for sleep. Cancel out any prior prescriptions on file         Objective:   BP 107/71   Pulse (!) 106   Ht 5' 1.5" (1.562 m)   Wt 269 lb (122 kg)   LMP 06/16/2018 Comment: states no way pregnant  SpO2 93%   BMI 50.00 kg/m   Wt Readings from Last 3 Encounters:  06/05/23 269 lb (122 kg)  11/19/22 268 lb (121.6 kg)  05/14/22 280 lb (127 kg)    Physical Exam Vitals and nursing note reviewed.  Constitutional:      General: She is not in acute distress.    Appearance: She is  well-developed. She is not diaphoretic.  Eyes:     Conjunctiva/sclera: Conjunctivae normal.  Cardiovascular:     Rate and Rhythm: Normal rate and regular rhythm.     Heart sounds: Normal heart sounds. No murmur heard. Pulmonary:     Effort: Pulmonary effort is normal. No respiratory distress.     Breath sounds: Normal breath sounds. No wheezing.  Musculoskeletal:        General: No swelling.  Skin:    General: Skin is warm and dry.     Findings: No rash.  Neurological:     Mental Status: She is alert and oriented to person, place, and time.     Coordination: Coordination normal.  Psychiatric:        Behavior: Behavior normal.       Assessment & Plan:   Problem List Items Addressed This Visit       Other   Insomnia - Primary   Relevant Medications   zolpidem  (AMBIEN  CR) 6.25 MG CR tablet   Other Relevant Orders   ToxASSURE Select 13 (MW), Urine   Dyslipidemia   Relevant Orders   CBC with Differential/Platelet   CMP14+EGFR   Lipid panel   Obesity   Relevant Orders   Bayer DCA Hb A1c Waived   CBC with Differential/Platelet   Other Visit Diagnoses       It band syndrome, left         For left IT band, give her a list of stretches and exercises that she can do and also recommended that she use icing and a frozen water bottle or rolling pin to help put pressure on it.  If not improving then we can do a physical therapy referral in the future.  Refilled Ambien , will do blood work on the way out Follow up plan: Return in about 6 months (around 12/06/2023), or if symptoms worsen or fail to improve, for Insomnia recheck.  Counseling provided for all of the vaccine components Orders Placed This Encounter  Procedures   ToxASSURE Select 13 (MW), Urine   Bayer DCA Hb A1c Waived   CBC with Differential/Platelet   CMP14+EGFR   Lipid panel    Jolyne Needs, MD [facility] 06/05/2023, 2:50 PM

## 2023-06-06 LAB — CBC WITH DIFFERENTIAL/PLATELET
Basophils Absolute: 0.1 10*3/uL (ref 0.0–0.2)
Basos: 1 %
EOS (ABSOLUTE): 0.1 10*3/uL (ref 0.0–0.4)
Eos: 1 %
Hematocrit: 41.5 % (ref 34.0–46.6)
Hemoglobin: 14 g/dL (ref 11.1–15.9)
Immature Grans (Abs): 0 10*3/uL (ref 0.0–0.1)
Immature Granulocytes: 0 %
Lymphocytes Absolute: 1.7 10*3/uL (ref 0.7–3.1)
Lymphs: 20 %
MCH: 30.8 pg (ref 26.6–33.0)
MCHC: 33.7 g/dL (ref 31.5–35.7)
MCV: 91 fL (ref 79–97)
Monocytes Absolute: 0.8 10*3/uL (ref 0.1–0.9)
Monocytes: 9 %
Neutrophils Absolute: 6.1 10*3/uL (ref 1.4–7.0)
Neutrophils: 69 %
Platelets: 318 10*3/uL (ref 150–450)
RBC: 4.54 x10E6/uL (ref 3.77–5.28)
RDW: 12.2 % (ref 11.7–15.4)
WBC: 8.8 10*3/uL (ref 3.4–10.8)

## 2023-06-06 LAB — CMP14+EGFR
ALT: 17 IU/L (ref 0–32)
AST: 20 IU/L (ref 0–40)
Albumin: 4.2 g/dL (ref 3.9–4.9)
Alkaline Phosphatase: 77 IU/L (ref 44–121)
BUN/Creatinine Ratio: 15 (ref 9–23)
BUN: 12 mg/dL (ref 6–24)
Bilirubin Total: 0.2 mg/dL (ref 0.0–1.2)
CO2: 22 mmol/L (ref 20–29)
Calcium: 9.1 mg/dL (ref 8.7–10.2)
Chloride: 104 mmol/L (ref 96–106)
Creatinine, Ser: 0.78 mg/dL (ref 0.57–1.00)
Globulin, Total: 2.5 g/dL (ref 1.5–4.5)
Glucose: 115 mg/dL — ABNORMAL HIGH (ref 70–99)
Potassium: 4 mmol/L (ref 3.5–5.2)
Sodium: 141 mmol/L (ref 134–144)
Total Protein: 6.7 g/dL (ref 6.0–8.5)
eGFR: 92 mL/min/{1.73_m2} (ref >59)

## 2023-06-06 LAB — LIPID PANEL
Chol/HDL Ratio: 3.5 ratio (ref 0.0–4.4)
Cholesterol, Total: 197 mg/dL (ref 100–199)
HDL: 57 mg/dL (ref >39)
LDL Chol Calc (NIH): 105 mg/dL — ABNORMAL HIGH (ref 0–99)
Triglycerides: 207 mg/dL — ABNORMAL HIGH (ref 0–149)
VLDL Cholesterol Cal: 35 mg/dL (ref 5–40)

## 2023-06-10 LAB — TOXASSURE SELECT 13 (MW), URINE

## 2023-06-14 ENCOUNTER — Ambulatory Visit: Payer: Self-pay | Admitting: Family Medicine

## 2023-09-13 ENCOUNTER — Ambulatory Visit: Payer: Self-pay | Admitting: *Deleted

## 2023-09-13 NOTE — Telephone Encounter (Signed)
 Appt 9-5-with MMM

## 2023-09-13 NOTE — Telephone Encounter (Signed)
 FYI Only or Action Required?: Action required by provider: request for appointment.  Patient was last seen in primary care on 06/05/2023 by Dettinger, Fonda LABOR, MD.  Called Nurse Triage reporting Back Pain.  Symptoms began a week ago.  Interventions attempted: Rest, hydration, or home remedies.  Symptoms are: gradually worsening.  Triage Disposition: See HCP Within 4 Hours (Or PCP Triage)  Patient/caregiver understands and will follow disposition?: Yes       Copied from CRM [id number]. Topic: Clinical - Red Word Triage >> Sep 13, 2023 11:44 AM Larissa RAMAN wrote: [facility] that prompted transfer to Nurse Triage: severe back pain Reason for Disposition  [1] SEVERE back pain (e.g., excruciating, unable to do any normal activities) AND [2] not improved 2 hours after pain medicine    Can do normal activities.  Answer Assessment - Initial Assessment Questions Requesting appt for ortho referral and possible medication to manage pain. Last medication prescribed tramadol  that helped with pain few years ago . Please advise if earlier appt available . Recommended if patient severe and constant go to ED.       1. ONSET: When did the pain begin? (e.g., minutes, hours, days)     1 week  2. LOCATION: Where does it hurt? (upper, mid or lower back)     Low back  3. SEVERITY: How bad is the pain?  (e.g., Scale 1-10; mild, moderate, or severe)     6/10  4. PATTERN: Is the pain constant? (e.g., yes, no; constant, intermittent)      Chronic  5. RADIATION: Does the pain shoot into your legs or somewhere else?     Down left leg  6. CAUSE:  What do you think is causing the back pain?      Disc issues  7. BACK OVERUSE:  Any recent lifting of heavy objects, strenuous work or exercise?     na 8. MEDICINES: What have you taken so far for the pain? (e.g., nothing, acetaminophen , NSAIDS)     na 9. NEUROLOGIC SYMPTOMS: Do you have any weakness, numbness, or problems with bowel/bladder  control?     no 10. OTHER SYMPTOMS: Do you have any other symptoms? (e.g., fever, abdomen pain, burning with urination, blood in urine)       Worsen back pain requesting another shot  11. PREGNANCY: Is there any chance you are pregnant? When was your last menstrual period?       Na  Protocols used: Back Pain-A-AH

## 2023-09-17 ENCOUNTER — Encounter (HOSPITAL_COMMUNITY): Payer: Self-pay

## 2023-09-17 ENCOUNTER — Other Ambulatory Visit: Payer: Self-pay

## 2023-09-17 ENCOUNTER — Emergency Department (HOSPITAL_COMMUNITY)

## 2023-09-17 ENCOUNTER — Emergency Department (HOSPITAL_COMMUNITY)
Admission: EM | Admit: 2023-09-17 | Discharge: 2023-09-17 | Disposition: A | Discharge status: Home / Self Care | Source: Ambulatory Visit | Attending: Emergency Medicine | Admitting: Emergency Medicine

## 2023-09-17 DIAGNOSIS — W19XXXA Unspecified fall, initial encounter: Secondary | ICD-10-CM | POA: Insufficient documentation

## 2023-09-17 DIAGNOSIS — M545 Low back pain, unspecified: Secondary | ICD-10-CM | POA: Diagnosis present

## 2023-09-17 DIAGNOSIS — M5432 Sciatica, left side: Secondary | ICD-10-CM

## 2023-09-17 DIAGNOSIS — N3000 Acute cystitis without hematuria: Secondary | ICD-10-CM | POA: Diagnosis not present

## 2023-09-17 DIAGNOSIS — M51362 Other intervertebral disc degeneration, lumbar region with discogenic back pain and lower extremity pain: Secondary | ICD-10-CM | POA: Diagnosis not present

## 2023-09-17 LAB — URINALYSIS, ROUTINE W REFLEX MICROSCOPIC
Bilirubin Urine: NEGATIVE
Glucose, UA: NEGATIVE mg/dL
Hgb urine dipstick: NEGATIVE
Ketones, ur: NEGATIVE mg/dL
Nitrite: NEGATIVE
Protein, ur: 30 mg/dL — AB
Specific Gravity, Urine: 1.023 (ref 1.005–1.030)
pH: 5 (ref 5.0–8.0)

## 2023-09-17 MED ORDER — PREDNISONE 50 MG PO TABS
60.0000 mg | ORAL_TABLET | Freq: Once | ORAL | Status: AC
Start: 2023-09-17 — End: 2023-09-17
  Administered 2023-09-17: 60 mg via ORAL
  Filled 2023-09-17: qty 1

## 2023-09-17 MED ORDER — OXYCODONE-ACETAMINOPHEN 5-325 MG PO TABS: 1.0000 | ORAL_TABLET | Freq: Four times a day (QID) | ORAL | 0 refills | Status: DC | PRN

## 2023-09-17 MED ORDER — OXYCODONE-ACETAMINOPHEN 5-325 MG PO TABS
1.0000 | ORAL_TABLET | Freq: Once | ORAL | Status: AC
  Administered 2023-09-17: 1 via ORAL
  Filled 2023-09-17: qty 1

## 2023-09-17 MED ORDER — PREDNISONE 10 MG PO TABS: ORAL_TABLET | ORAL | 0 refills | Status: DC

## 2023-09-17 NOTE — ED Triage Notes (Signed)
 Pt arrived via POV from home c/o left lower back pain and leg pain from a fall at home this morning. Pt reports her left leg gave out on her and she fell. Pt denies blood thinners, denies LOC, denies hitting her head.

## 2023-09-17 NOTE — Discharge Instructions (Signed)
 Take your next dose of prednisone  tomorrow morning.  You have also been prescribed Percocet which is a narcotic pain reliever, use this medication sparingly but as needed for pain relief.  You cannot drive within 4 hours of taking this medicine as it will make you drowsy.  Additionally it can cause constipation, you may benefit from taking a stool softener such as Colace to minimize the risk of this side effect.  I do recommend a heating pad for 20 minutes 3-4 times daily over your lower back area where your pain originates.  Stay as active as you comfortably can but avoid any activity that worsens your pain.  Call Dr. Eldonna for follow-up care.

## 2023-09-17 NOTE — ED Provider Notes (Addendum)
 [place] [facility] DEPARTMENT AT [facility] Provider Note   CSN: [id number] Arrival date & time: 09/17/23  9142     Patient presents with: Ruth Rivers is a 48 y.o. female with known degenerative disc disease at the L4-S1 levels presenting for evaluation of a 1 week history of worsening sciatica along with weakness in her left leg.  She was walking into her classroom this morning, when her left leg gave out and she fell.  She has persistent pain in her lower left back region.  She denies midline lumbar pain.  She denies having episodes of urinary or fecal incontinence or retention but does note urgency for the past week.  No dysuria, no fevers or chills.  She denies any other injuries including head injury with her fall.  She has taken ibuprofen, took 600 mg this morning.  She has seen Dr. Eldonna with orthopedics, last had a steroid injection in her lumbar spine approximately 3 years ago.   The history is provided by the patient.       Prior to Admission medications   Medication Sig Start Date End Date Taking? Authorizing Provider  oxyCODONE -acetaminophen  (PERCOCET/ROXICET) 5-325 MG tablet Take 1 tablet by mouth every 6 (six) hours as needed for severe pain (pain score 7-10). 09/17/23  Yes Dawt Reeb, PA-C  predniSONE  (DELTASONE ) 10 MG tablet 6, 5, 4, 3, 2 then 1 tablet by mouth daily for 6 days total. 09/17/23  Yes Ramanda Paules, PA-C  buPROPion (WELLBUTRIN XL) 300 MG 24 hr tablet Take 300 mg by mouth daily. 08/27/12   [provider]  calcium -vitamin D  (OSCAL WITH D) 500-200 MG-UNIT tablet Take 1 tablet by mouth daily.    [provider]  clonazePAM (KLONOPIN) 0.5 MG tablet Take 0.25-0.5 mg by mouth 2 (two) times daily as needed for anxiety. 11/10/21   [provider]  Multiple Vitamin (MULTI-VITAMIN) tablet Take 1 tablet by mouth daily.    [provider]  sulfamethoxazole -trimethoprim  (BACTRIM  DS) 800-160 MG tablet Take 1 tablet by  mouth 2 (two) times daily for 5 days. 09/18/23 09/23/23  Kyri Dai, PA-C  traZODone (DESYREL) 100 MG tablet Take 200-300 mg by mouth at bedtime. 09/29/21   [provider]  vortioxetine HBr (TRINTELLIX) 20 MG TABS tablet Take 20 mg by mouth daily.    [provider]  VRAYLAR 6 MG CAPS Take 6 mg by mouth daily. 10/30/21   [provider]  zolpidem  (AMBIEN  CR) 6.25 MG CR tablet Take 1-2 tablets (6.25-12.5 mg total) by mouth at bedtime as needed for sleep. Cancel out any prior prescriptions on file 06/05/23   Dettinger, Fonda LABOR, MD    Allergies: Cephalosporins and Penicillins    Review of Systems  Constitutional:  Negative for chills and fever.  Respiratory:  Negative for shortness of breath.   Cardiovascular:  Negative for chest pain and leg swelling.  Gastrointestinal:  Negative for abdominal distention, abdominal pain and constipation.  Genitourinary:  Positive for urgency. Negative for difficulty urinating, dysuria, flank pain and frequency.  Musculoskeletal:  Positive for arthralgias and back pain. Negative for gait problem, joint swelling and myalgias.  Skin:  Negative for rash.  Neurological:  Positive for weakness. Negative for numbness.    Updated Vital Signs BP 112/87 (BP Location: Left Arm)   Pulse 67   Temp 98.5 F (36.9 C) (Oral)   Resp 16   Ht 5' 1.5 (1.562 m)   Wt 122 kg   LMP 06/16/2018  Comment: states no way pregnant  SpO2 95%   BMI 50.00 kg/m   Physical Exam Vitals and nursing note reviewed.  Constitutional:      Appearance: She is well-developed.  HENT:     Head: Normocephalic.  Eyes:     Conjunctiva/sclera: Conjunctivae normal.  Cardiovascular:     Rate and Rhythm: Normal rate.     Pulses: Normal pulses.     Comments: Pedal pulses normal. Pulmonary:     Effort: Pulmonary effort is normal.  Abdominal:     General: There is no distension.     Palpations: Abdomen is soft.  Musculoskeletal:        General: Normal range of  motion.     Cervical back: Normal range of motion and neck supple.     Lumbar back: Tenderness present. No swelling, edema or spasms.     Comments: Ttp left lower paralumbar/ SI joint tenderness.  Pain with active attempts ROM left leg at hip/knee.  + SLR left .  Skin:    General: Skin is warm and dry.  Neurological:     Mental Status: She is alert.     Sensory: No sensory deficit.     Motor: Weakness present. No tremor or atrophy.     Gait: Gait normal.     Comments: No strength deficit noted in hip and knee flexor and extensor muscle groups.  Reduced strength left ankle,  5/5 right ankle,  2/5 left, resists against gravity only, no foot drop.      (all labs ordered are listed, but only abnormal results are displayed) Labs Reviewed  URINALYSIS, ROUTINE W REFLEX MICROSCOPIC - Abnormal; Notable for the following components:      Result Value   APPearance CLOUDY (*)    Protein, ur 30 (*)    Leukocytes,Ua TRACE (*)    Bacteria, UA MANY (*)    All other components within normal limits    EKG: None  Radiology: MR LUMBAR SPINE WO CONTRAST Result Date: 09/17/2023 EXAM: MRI LUMBAR SPINE 09/17/2023 10:12:02 AM TECHNIQUE: Multiplanar multisequence MRI of the lumbar spine was performed without the administration of intravenous contrast. COMPARISON: 09/15/2021 CLINICAL HISTORY: Low back pain, cauda equina syndrome suspected. FINDINGS: BONES AND ALIGNMENT: Mild levoscoliosis of the lumbar spine. Normal vertebral body heights. Bone marrow signal is unremarkable. SPINAL CORD: The conus medullaris terminates at the mid body of T12. SOFT TISSUES: No paraspinal mass. L1-L2: No significant disc herniation. No spinal canal stenosis or neural foraminal narrowing. L2-L3: No significant disc herniation. No spinal canal stenosis or neural foraminal narrowing. L3-L4: Minimal disc bulging resulting in mild bilateral lateral recess stenosis. L4-L5: Mild diffuse disc bulging causing mild central spinal canal  stenosis and mild bilateral lateral recess stenosis. L5-S1: No significant disc herniation. No spinal canal stenosis or neural foraminal narrowing. IMPRESSION: 1. Mild levoscoliosis of the lumbar spine. 2. Minimal disc bulging at L3-4 resulting in mild bilateral lateral recess stenosis. 3. Mild diffuse disc bulging at L4-5 causing mild central spinal canal stenosis and mild bilateral lateral recess stenosis. Electronically signed by: Evalene Coho MD 09/17/2023 10:44 AM EDT RP Workstation: [id number]     Procedures   Medications Ordered in the ED  oxyCODONE -acetaminophen  (PERCOCET/ROXICET) 5-325 MG per tablet 1 tablet (1 tablet Oral Given 09/17/23 1030)  predniSONE  (DELTASONE ) tablet 60 mg (60 mg Oral Given 09/17/23 1118)  Medical Decision Making Patient presenting with acute on chronic intermittent low back pain in the distribution suggesting acute sciatica.  However she had an episode where she felt her left leg collapse this morning while walking at work raising concerns about cauda equina or cord compression, she has known degenerative disc disease in her lumbar spine, previously treated by Dr. Eldonna in [place], has not needed her his care in approximately 3 years.  Her exam initially was concerning as she had weakness in her left ankle, however attempting to resist flexion at the left ankle increased her back pain.  After she was treated with pain medication and her pain was improved she was able to ambulate without weakness or foot drop.  In the interim she had also undergone MRI today which although showed degenerative disease there was normal evidence for cord compression or cauda equina.  She would benefit from follow-up care with Dr. Eldonna, in the interim she is prescribed oxycodone  with caution regarding sedation, prednisone  taper.  Additionally she was having increased urinary frequency without dysuria, her UA does reflect bacteriuria, this will be  covered with a 5-day course of Bactrim .  Amount and/or Complexity of Data Reviewed Labs: ordered.    Details: UA cloudy, bacteriuria, 6-10 WBCs. Radiology: ordered.    Details: MRI reviewed, minimal degenerative disc disease at the L3-L5 level, spinal canal stenosis which is mild, no cord compression, no cauda equina.  Risk Prescription drug management.        Final diagnoses:  Sciatica of left side  Degeneration of intervertebral disc of lumbar region with discogenic back pain and lower extremity pain  Acute cystitis without hematuria    ED Discharge Orders          Ordered    oxyCODONE -acetaminophen  (PERCOCET/ROXICET) 5-325 MG tablet  Every 6 hours PRN        09/17/23 1112    predniSONE  (DELTASONE ) 10 MG tablet        09/17/23 1112               Birdena Clarity, PA-C 09/18/23 0936    Birdena Clarity, PA-C 09/18/23 9057    Melvenia Motto, MD 09/23/23 [phone]

## 2023-09-18 ENCOUNTER — Telehealth (HOSPITAL_COMMUNITY): Payer: Self-pay | Admitting: Emergency Medicine

## 2023-09-18 MED ORDER — SULFAMETHOXAZOLE-TRIMETHOPRIM 800-160 MG PO TABS: 1.0000 | ORAL_TABLET | Freq: Two times a day (BID) | ORAL | 0 refills | Status: AC

## 2023-09-18 NOTE — Telephone Encounter (Cosign Needed)
 Upon chart completion, noted pt had significant bacteruria on UA result.  Along with urgency sx (no dysuria), abx added to tx plan.  Bactrim  script sent to pharmacy,  pt notified.

## 2023-09-20 ENCOUNTER — Encounter: Payer: Self-pay | Admitting: Nurse Practitioner

## 2023-09-20 ENCOUNTER — Ambulatory Visit: Admitting: Nurse Practitioner

## 2023-09-20 VITALS — BP 146/86 | HR 83 | Temp 98.1°F | Ht 61.0 in | Wt 272.0 lb

## 2023-09-20 DIAGNOSIS — M549 Dorsalgia, unspecified: Secondary | ICD-10-CM | POA: Diagnosis not present

## 2023-09-20 DIAGNOSIS — M5430 Sciatica, unspecified side: Secondary | ICD-10-CM

## 2023-09-20 MED ORDER — KETOROLAC TROMETHAMINE 60 MG/2ML IM SOLN
60.0000 mg | Freq: Once | INTRAMUSCULAR | Status: AC
  Administered 2023-09-20: 60 mg via INTRAMUSCULAR

## 2023-09-20 NOTE — Progress Notes (Signed)
   Subjective:    Patient ID: Ruth Rivers, female    DOB: 1975-10-13, 48 y.o.   MRN: [id number]   Chief Complaint: Back Pain (Needs referral to spine specialist that she has seen before. Seen at ER and they advised follow up with them)   Back Pain    Patient in with chronic back pain. Has seen spine specialist in the past and wants referral back  to them. Back pain restarted a week or so ago. Rates pain 7/10 currently. Pain radiates all the way down to her foot on left. Sitting still and walking increase pain. Has been taking motrin and percocet, with some relief.Was seen in ED and they recommended follow up with specialist. They also prescribed her prednisone  dose pack which she is still taking. Patient Active Problem List   Diagnosis Date Noted   Dyslipidemia 06/19/2021   Seasonal allergic rhinitis 09/26/2018   PUD (peptic ulcer disease) 05/03/2015   Insomnia 02/03/2015   Gastritis 01/02/2012   Hx of adenomatous colonic polyps 01/02/2012   Anxiety and depression 11/23/2011   Obesity 11/23/2011       Review of Systems  Musculoskeletal:  Positive for back pain.       Objective:   Physical Exam Constitutional:      Appearance: Normal appearance.  Cardiovascular:     Rate and Rhythm: Normal rate and regular rhythm.  Pulmonary:     Effort: Pulmonary effort is normal.     Breath sounds: Normal breath sounds.  Musculoskeletal:     Comments: Decrease ROM of lumbar spine with flexion extension and rotation.  Patient unable to seat still due to pain. (+) SLR on left  Skin:    General: Skin is warm.  Neurological:     General: No focal deficit present.     Mental Status: She is alert and oriented to person, place, and time.  Psychiatric:        Mood and Affect: Mood normal.        Behavior: Behavior normal.    BP (!) 146/86   Pulse 83   Temp 98.1 F (36.7 C) (Temporal)   Ht 5' 1 (1.549 m)   Wt 272 lb (123.4 kg)   LMP 06/16/2018 Comment: states no way pregnant   SpO2 94%   BMI 51.39 kg/m         Assessment & Plan:   Paden Ipock in today with chief complaint of Back Pain (Needs referral to spine specialist that she has seen before. Seen at ER and they advised follow up with them)   1. Back pain with sciatica (Primary) Moist heat Stretches RTO prn - Ambulatory referral to Spine Surgery - ketorolac  (TORADOL ) injection 60 mg    The above assessment and management plan was discussed with the patient. The patient verbalized understanding of and has agreed to the management plan. Patient is aware to call the clinic if symptoms persist or worsen. Patient is aware when to return to the clinic for a follow-up visit. Patient educated on when it is appropriate to go to the emergency department.   Mary-Margaret Gladis, FNP

## 2023-09-24 ENCOUNTER — Ambulatory Visit: Payer: Self-pay

## 2023-09-24 NOTE — Telephone Encounter (Signed)
 Please call patient

## 2023-09-24 NOTE — Telephone Encounter (Signed)
 FYI Only or Action Required?: Action required by provider: Request Call Back, pain medication.  Patient was last seen in primary care on 09/20/2023 by Ruth Mustard, FNP.  Called Nurse Triage reporting Back Pain.  Symptoms began several days ago.  Interventions attempted: OTC medications: ibuprofen and tylenol , not effective. Out of percocet.  Symptoms are: unchanged.  Triage Disposition: See PCP When Office is Open (Within 3 Days)  Patient/caregiver understands and will follow disposition?: YesCopied from CRM #[id number]. Topic: Clinical - Red Word Triage >> Sep 24, 2023  1:05 PM Lauren C wrote: Red Word that prompted transfer to Nurse Triage: Worsening back pain. Previous red word for this same issue 8/29. Pt had visit with NP Ruth on 9/5 where she had an injection, pain pills have since ran out. Reason for Disposition  [1] Pain radiates into the thigh or further down the leg AND [2] one leg    Pt declines appt, just seen in clinic 09/20/23 for same pain, no new symptoms  Answer Assessment - Initial Assessment Questions Patient requesting call back/ pain medication. LOV 09/20/23. Advised UC/ED if symptoms worsen.   1. ONSET: When did the pain begin? (e.g., minutes, hours, days)     09/13/23 2. LOCATION: Where does it hurt? (upper, mid or lower back)     Lower back pain, 3. SEVERITY: How bad is the pain?  (e.g., Scale 1-10; mild, moderate, or severe)     7/10 4. PATTERN: Is the pain constant? (e.g., yes, no; constant, intermittent)      constant 5. RADIATION: Does the pain shoot into your legs or somewhere else?     Just in back, radiating left leg 6. CAUSE:  What do you think is causing the back pain?      na 7. BACK OVERUSE:  Any recent lifting of heavy objects, strenuous work or exercise?     na 8. MEDICINES: What have you taken so far for the pain? (e.g., nothing, acetaminophen , NSAIDS)     Toradol  shot given at clinic, percocet ran out last night,  ibuprofen and tylenol  not helping 9. NEUROLOGIC SYMPTOMS: Do you have any weakness, numbness, or problems with bowel/bladder control?     Left leg feels numbness/weakness already had mri for it; normal Denies problems b/b 10. OTHER SYMPTOMS: Do you have any other symptoms? (e.g., fever, abdomen pain, burning with urination, blood in urine)       denies  Protocols used: Back Pain-A-AH

## 2023-09-25 ENCOUNTER — Ambulatory Visit: Admitting: Family Medicine

## 2023-09-25 ENCOUNTER — Encounter: Payer: Self-pay | Admitting: Family Medicine

## 2023-09-25 VITALS — BP 126/86 | HR 108 | Temp 98.2°F | Ht 62.0 in | Wt 272.4 lb

## 2023-09-25 DIAGNOSIS — M5442 Lumbago with sciatica, left side: Secondary | ICD-10-CM | POA: Diagnosis not present

## 2023-09-25 DIAGNOSIS — M51369 Other intervertebral disc degeneration, lumbar region without mention of lumbar back pain or lower extremity pain: Secondary | ICD-10-CM

## 2023-09-25 MED ORDER — KETOROLAC TROMETHAMINE 30 MG/ML IJ SOLN
30.0000 mg | Freq: Once | INTRAMUSCULAR | Status: AC
  Administered 2023-09-25: 30 mg via INTRAMUSCULAR

## 2023-09-25 MED ORDER — METHOCARBAMOL 500 MG PO TABS: 500.0000 mg | ORAL_TABLET | Freq: Three times a day (TID) | ORAL | 1 refills | Status: DC | PRN

## 2023-09-25 MED ORDER — CELECOXIB 200 MG PO CAPS: 200.0000 mg | ORAL_CAPSULE | Freq: Two times a day (BID) | ORAL | 0 refills | Status: DC | PRN

## 2023-09-25 MED ORDER — GABAPENTIN 300 MG PO CAPS: 300.0000 mg | ORAL_CAPSULE | Freq: Two times a day (BID) | ORAL | 1 refills | Status: DC | PRN

## 2023-09-25 MED ORDER — OXYCODONE-ACETAMINOPHEN 5-325 MG PO TABS: 1.0000 | ORAL_TABLET | Freq: Three times a day (TID) | ORAL | 0 refills | Status: DC | PRN

## 2023-09-25 NOTE — Patient Instructions (Signed)
You have prescribed a nonsteroidal anti-inflammatory drug (NSAID) today. This will help with your pain and inflammation. Please do not take any other NSAIDs (ibuprofen/Motrin/Advil, naproxen/Aleve, meloxicam/Mobic, Voltaren/diclofenac). Please make sure to eat a meal when taking this medication.   Caution:  If you have a history of acid reflux/indigestion, I recommend that you take an antacid (such as Prilosec, Prevacid) daily while on the NSAID.  If you have a history of bleeding disorder, gastric ulcer, are on a blood thinner (like warfarin/Coumadin, Xarelto, Eliquis, etc) please do not take NSAID.  If you have ever had a heart attack, you should not take NSAIDs.

## 2023-09-25 NOTE — Progress Notes (Signed)
 Subjective: RR:ajrx pain PCP: Dettinger, Fonda LABOR, MD YEP:Umjrp Rickenbach is a 48 y.o. female presenting to clinic today for:  Discussed the use of AI scribe software for clinical note transcription with the patient, who gave verbal consent to proceed.  History of Present Illness   Ruth Rivers is a 48 year old female with a history of a ruptured disc who presents with severe back pain.  She has been experiencing severe back pain for approximately three weeks, primarily located in the lower back and radiating down the left leg, causing sciatica. She experiences weakness and numbness in the leg but no buckling. 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  She has a history of a ruptured disc, with the last episode occurring three years ago, treated with a spinal injection. Recently, she fell and subsequently visited the ER where an MRI confirmed another rupture.  For pain management, she has been using Percocet, which was provided in the ER. She takes Klonopin as needed and has been on a prednisone  dose pack, which provided some relief last week. She has been taking ibuprofen without any kidney or stomach issues. She has not tried muscle relaxants or medications like gabapentin  or Lyrica before.      ROS: Per HPI  Allergies  Allergen Reactions   Cephalosporins Itching and Rash   Penicillins Rash   Past Medical History:  Diagnosis Date   Adenomatous colon polyp    Allergy    seasonal   Anxiety    Cellulitis    Cough    had a cough for 2 weeks/ in isolation for over 2 weeks/ no fever/ in March of 2020   Depression    Gastritis    GERD (gastroesophageal reflux disease)    History of herniated intervertebral disc    Insomnia    Obesity    Post-operative nausea and vomiting     Current Outpatient Medications:    buPROPion (WELLBUTRIN XL) 300 MG 24 hr tablet, Take 300 mg by mouth daily., Disp: , Rfl:    calcium -vitamin D  (OSCAL WITH D) 500-200 MG-UNIT tablet,  Take 1 tablet by mouth daily., Disp: , Rfl:    celecoxib  (CELEBREX ) 200 MG capsule, Take 1 capsule (200 mg total) by mouth 2 (two) times daily as needed for moderate pain (pain score 4-6) (no ibuprofen while taking)., Disp: 60 capsule, Rfl: 0   clonazePAM (KLONOPIN) 0.5 MG tablet, Take 0.25-0.5 mg by mouth 2 (two) times daily as needed for anxiety., Disp: , Rfl:    gabapentin  (NEURONTIN ) 300 MG capsule, Take 1 capsule (300 mg total) by mouth 2 (two) times daily as needed (sciatica)., Disp: 60 capsule, Rfl: 1   methocarbamol  (ROBAXIN ) 500 MG tablet, Take 1 tablet (500 mg total) by mouth every 8 (eight) hours as needed for muscle spasms., Disp: 30 tablet, Rfl: 1   Multiple Vitamin (MULTI-VITAMIN) tablet, Take 1 tablet by mouth daily., Disp: , Rfl:    traZODone (DESYREL) 100 MG tablet, Take 200-300 mg by mouth at bedtime., Disp: , Rfl:    vortioxetine HBr (TRINTELLIX) 20 MG TABS tablet, Take 20 mg by mouth daily., Disp: , Rfl:    VRAYLAR 6 MG CAPS, Take 6 mg by mouth daily., Disp: , Rfl:    zolpidem  (AMBIEN  CR) 6.25 MG CR tablet, Take 1-2 tablets (6.25-12.5 mg total) by mouth at bedtime as needed for sleep. Cancel out any prior prescriptions on file, Disp: 60 tablet, Rfl: 5   oxyCODONE -acetaminophen  (PERCOCET/ROXICET) 5-325  MG tablet, Take 1 tablet by mouth every 8 (eight) hours as needed for severe pain (pain score 7-10)., Disp: 30 tablet, Rfl: 0 Social History   Socioeconomic History   Marital status: Married    Spouse name: Not on file   Number of children: 1   Years of education: Not on file   Highest education level: Master's degree (e.g., MA, MS, MEng, MEd, MSW, MBA)  Occupational History   Occupation: Magazine features editor: [employer]  Tobacco Use   Smoking status: Never   Smokeless tobacco: Never  Vaping Use   Vaping status: Never Used  Substance and Sexual Activity   Alcohol use: Not Currently   Drug use: No   Sexual activity: Not on file  Other Topics Concern   Not  on file  Social History Narrative   Not on file   Social Drivers of Health   Financial Resource Strain: Low Risk  (09/19/2023)   Overall Financial Resource Strain (CARDIA)    Difficulty of Paying Living Expenses: Not very hard  Food Insecurity: No Food Insecurity (09/19/2023)   Hunger Vital Sign    Worried About Running Out of Food in the Last Year: Never true    Ran Out of Food in the Last Year: Never true  Transportation Needs: No Transportation Needs (09/19/2023)   PRAPARE - Administrator, Civil Service (Medical): No    Lack of Transportation (Non-Medical): No  Physical Activity: Insufficiently Active (09/19/2023)   Exercise Vital Sign    Days of Exercise per Week: 4 days    Minutes of Exercise per Session: 20 min  Stress: Stress Concern Present (09/19/2023)   [employer] of Occupational Health - Occupational Stress Questionnaire    Feeling of Stress: Very much  Social Connections: Moderately Integrated (09/19/2023)   Social Connection and Isolation Panel    Frequency of Communication with Friends and Family: Three times a week    Frequency of Social Gatherings with Friends and Family: Once a week    Attends Religious Services: Patient declined    Database administrator or Organizations: Yes    Attends Engineer, structural: More than 4 times per year    Marital Status: Married  Catering manager Violence: Not At Risk (05/23/2021)   [facility]   Humiliation, Afraid, Rape, and Kick questionnaire    Within the last year, have you been afraid of your partner or ex-partner?: No    Within the last year, have you been humiliated or emotionally abused in other ways by your partner or ex-partner?: No    Within the last year, have you been kicked, hit, slapped, or otherwise physically hurt by your partner or ex-partner?: No    Within the last year, have you been raped or forced to have any kind of sexual activity by your partner or ex-partner?: No    Family History  Problem Relation Age of Onset   Colon polyps Mother    Colon polyps Father    Breast cancer Maternal Grandmother    Colon cancer Maternal Aunt        dx 6's died within 1 year   Other Cousin        Non-Hodgkins lymphoma   Breast cancer Other        PGGM   Autism Son    Schizophrenia Son    Esophageal cancer Neg Hx    Rectal cancer Neg Hx    Stomach cancer  Neg Hx    Pancreatic cancer Neg Hx     Objective: Office vital signs reviewed. BP 126/86   Pulse (!) 108   Temp 98.2 F (36.8 C)   Ht 5' 2 (1.575 m)   Wt 272 lb 6 oz (123.5 kg)   LMP 06/16/2018 Comment: states no way pregnant  SpO2 94%   BMI 49.82 kg/m   Physical Examination:  General: Awake, alert, morbidly obese, tearful, appears uncomfortable sitting MSK: antalgic gait. Ambulating independently. No midline TTP to lumbar spine but left sided TTP to paraspinals in lumbar region. No deformities palpated. Sensation seems to be in tact.  MR LUMBAR SPINE WO CONTRAST Result Date: 09/17/2023 EXAM: MRI LUMBAR SPINE 09/17/2023 10:12:02 AM TECHNIQUE: Multiplanar multisequence MRI of the lumbar spine was performed without the administration of intravenous contrast. COMPARISON: 09/15/2021 CLINICAL HISTORY: Low back pain, cauda equina syndrome suspected. FINDINGS: BONES AND ALIGNMENT: Mild levoscoliosis of the lumbar spine. Normal vertebral body heights. Bone marrow signal is unremarkable. SPINAL CORD: The conus medullaris terminates at the mid body of T12. SOFT TISSUES: No paraspinal mass. L1-L2: No significant disc herniation. No spinal canal stenosis or neural foraminal narrowing. L2-L3: No significant disc herniation. No spinal canal stenosis or neural foraminal narrowing. L3-L4: Minimal disc bulging resulting in mild bilateral lateral recess stenosis. L4-L5: Mild diffuse disc bulging causing mild central spinal canal stenosis and mild bilateral lateral recess stenosis. L5-S1: No significant disc herniation. No  spinal canal stenosis or neural foraminal narrowing. IMPRESSION: 1. Mild levoscoliosis of the lumbar spine. 2. Minimal disc bulging at L3-4 resulting in mild bilateral lateral recess stenosis. 3. Mild diffuse disc bulging at L4-5 causing mild central spinal canal stenosis and mild bilateral lateral recess stenosis. Electronically signed by: Evalene Coho MD 09/17/2023 10:44 AM EDT RP Workstation: [id number]   Assessment/ Plan: 48 y.o. female   Bulging of lumbar intervertebral disc - Plan: ketorolac  (TORADOL ) 30 MG/ML injection 30 mg, oxyCODONE -acetaminophen  (PERCOCET/ROXICET) 5-325 MG tablet, celecoxib  (CELEBREX ) 200 MG capsule, methocarbamol  (ROBAXIN ) 500 MG tablet, gabapentin  (NEURONTIN ) 300 MG capsule  Acute left-sided low back pain with left-sided sciatica - Plan: ketorolac  (TORADOL ) 30 MG/ML injection 30 mg, oxyCODONE -acetaminophen  (PERCOCET/ROXICET) 5-325 MG tablet, celecoxib  (CELEBREX ) 200 MG capsule, methocarbamol  (ROBAXIN ) 500 MG tablet, gabapentin  (NEURONTIN ) 300 MG capsule  Assessment and Plan    Lumbar disc herniation with left-sided sciatica Recurrent herniation with severe pain, weakness, and numbness confirmed by MRI. Previous spinal injections effective. Current medications provide temporary relief. No NSAID contraindications. - Prescribe Percocet for pain management. National narcotic database reviewed, no red flags. - Prescribe Robaxin  for muscle relaxation. - Prescribe gabapentin  for nerve pain, starting at bedtime, increasing to daytime. - Prescribe Celebrex  for inflammation given use of Trintellix and risk of GI bleed with traditional NSAIDs. - Administer in-office Toradol  30 IM - Advise against driving on sedating medications. - Recommend stool softener to prevent opioid-induced constipation. - Advise holding zolpidem  if gabapentin  aids sleep. - Provide work absence letter. Anticipate need for FMLA - Encourage contacting spine specialist for earlier appointment. -  Follow up with PCP in 10 days for reassessment, extension/ change in meds      Aurorah Schlachter CHRISTELLA Fielding, DO [facility] [phone]

## 2023-09-26 NOTE — Telephone Encounter (Signed)
 Patient was seen in office yesterday for this issue

## 2023-09-26 NOTE — Telephone Encounter (Signed)
 She is suppose t obe seeing back specialist today I thought

## 2023-10-09 ENCOUNTER — Inpatient Hospital Stay
Admission: RE | Admit: 2023-10-09 | Discharge: 2023-10-09 | Disposition: A | Discharge status: Home / Self Care | Payer: Self-pay | Source: Ambulatory Visit | Attending: Orthopedic Surgery | Admitting: Orthopedic Surgery

## 2023-10-09 ENCOUNTER — Other Ambulatory Visit: Payer: Self-pay | Admitting: Family Medicine

## 2023-10-09 DIAGNOSIS — Z049 Encounter for examination and observation for unspecified reason: Secondary | ICD-10-CM

## 2023-10-14 NOTE — Progress Notes (Unsigned)
 Referring Physician:  Gladis Mustard, FNP [address] [place],  [place] [address]  Primary Physician:  Dettinger, Fonda LABOR, MD  History of Present Illness: 10/14/2023*** Ruth Rivers has a history of gastritis, PUD, anxiety, depression, obesity, dyslipidemia.   Left low back pain, radiating to left buttocks down left leg into left foot Left leg weakness, left leg gave out on 09/17/2023 and patient fell   She is on celebrex , neurontin , robaxin , and percocet 5.   Duration: *** Location: *** Quality: *** Severity: ***  Precipitating: aggravated by *** Modifying factors: made better by *** Weakness: none Timing: ***  Tobacco use: Does not smoke.   Bowel/Bladder Dysfunction: none  Conservative measures: HEP Physical therapy: *** has not participated in Multimodal medical therapy including regular antiinflammatories: *** Prednisone , Motrin, Percocet, Celebrex , Gabapentin , Robaxin  Injections: *** 06/19/2022- ESI Left L5-S1  10/19/2021--ESI left L4-5  10/02/2017- ESI Left L3-L4 11/11/2017- ESI Left L4-L5   Past Surgery: ***no spine surgery  Ruth Rivers has ***no symptoms of cervical myelopathy.  The symptoms are causing a significant impact on the patient's life.   Review of Systems:  A 10 point review of systems is negative, except for the pertinent positives and negatives detailed in the HPI.  Past Medical History: Past Medical History:  Diagnosis Date   Adenomatous colon polyp    Allergy    seasonal   Anxiety    Cellulitis    Cough    had a cough for 2 weeks/ in isolation for over 2 weeks/ no fever/ in March of 2020   Depression    Gastritis    GERD (gastroesophageal reflux disease)    History of herniated intervertebral disc    Insomnia    Obesity    Post-operative nausea and vomiting     Past Surgical History: Past Surgical History:  Procedure Laterality Date   BREAST REDUCTION SURGERY  2001   CESAREAN SECTION     one time    CHOLECYSTECTOMY  10/1998   COLONOSCOPY  2013   COLONOSCOPY W/ BIOPSIES     POLYPECTOMY  2013   TONSILLECTOMY      Allergies: Allergies as of 10/17/2023 - Review Complete 09/25/2023  Allergen Reaction Noted   Cephalosporins Itching and Rash 02/03/2015   Penicillins Rash 10/02/2011    Medications: Outpatient Encounter Medications as of 10/17/2023  Medication Sig   buPROPion (WELLBUTRIN XL) 300 MG 24 hr tablet Take 300 mg by mouth daily.   calcium -vitamin D  (OSCAL WITH D) 500-200 MG-UNIT tablet Take 1 tablet by mouth daily.   celecoxib  (CELEBREX ) 200 MG capsule Take 1 capsule (200 mg total) by mouth 2 (two) times daily as needed for moderate pain (pain score 4-6) (no ibuprofen while taking).   clonazePAM (KLONOPIN) 0.5 MG tablet Take 0.25-0.5 mg by mouth 2 (two) times daily as needed for anxiety.   gabapentin  (NEURONTIN ) 300 MG capsule Take 1 capsule (300 mg total) by mouth 2 (two) times daily as needed (sciatica).   methocarbamol  (ROBAXIN ) 500 MG tablet Take 1 tablet (500 mg total) by mouth every 8 (eight) hours as needed for muscle spasms.   Multiple Vitamin (MULTI-VITAMIN) tablet Take 1 tablet by mouth daily.   oxyCODONE -acetaminophen  (PERCOCET/ROXICET) 5-325 MG tablet Take 1 tablet by mouth every 8 (eight) hours as needed for severe pain (pain score 7-10).   traZODone (DESYREL) 100 MG tablet Take 200-300 mg by mouth at bedtime.   vortioxetine HBr (TRINTELLIX) 20 MG TABS tablet Take 20 mg by mouth daily.  VRAYLAR 6 MG CAPS Take 6 mg by mouth daily.   zolpidem  (AMBIEN  CR) 6.25 MG CR tablet Take 1-2 tablets (6.25-12.5 mg total) by mouth at bedtime as needed for sleep. Cancel out any prior prescriptions on file   No facility-administered encounter medications on file as of 10/17/2023.    Social History: Social History   Tobacco Use   Smoking status: Never   Smokeless tobacco: Never  Vaping Use   Vaping status: Never Used  Substance Use Topics   Alcohol use: Not Currently   Drug  use: No    Family Medical History: Family History  Problem Relation Age of Onset   Colon polyps Mother    Colon polyps Father    Breast cancer Maternal Grandmother    Colon cancer Maternal Aunt        dx 50's died within 1 year   Other Cousin        Non-Hodgkins lymphoma   Breast cancer Other        PGGM   Autism Son    Schizophrenia Son    Esophageal cancer Neg Hx    Rectal cancer Neg Hx    Stomach cancer Neg Hx    Pancreatic cancer Neg Hx     Physical Examination: There were no vitals filed for this visit.  General: Patient is well developed, well nourished, calm, collected, and in no apparent distress. Attention to examination is appropriate.  Respiratory: Patient is breathing without any difficulty.   NEUROLOGICAL:     Awake, alert, oriented to person, place, and time.  Speech is clear and fluent. Fund of knowledge is appropriate.   Cranial Nerves: Pupils equal round and reactive to light.  Facial tone is symmetric.    *** ROM of cervical spine *** pain *** posterior cervical tenderness. *** tenderness in bilateral trapezial region.   *** ROM of lumbar spine *** pain *** posterior lumbar tenderness.   No abnormal lesions on exposed skin.   Strength: Side Biceps Triceps Deltoid Interossei Grip Wrist Ext. Wrist Flex.  R 5 5 5 5 5 5 5   L 5 5 5 5 5 5 5    Side Iliopsoas Quads Hamstring PF DF EHL  R 5 5 5 5 5 5   L 5 5 5 5 5 5    Reflexes are ***2+ and symmetric at the biceps, brachioradialis, patella and achilles.   Hoffman's is absent.  Clonus is not present.   Bilateral upper and lower extremity sensation is intact to light touch.     Gait is normal.   ***No difficulty with tandem gait.    Medical Decision Making  Imaging: Lumbar MRI dated 09/17/23:  FINDINGS:   BONES AND ALIGNMENT: Mild levoscoliosis of the lumbar spine. Normal vertebral body heights. Bone marrow signal is unremarkable.   SPINAL CORD: The conus medullaris terminates at the mid  body of T12.   SOFT TISSUES: No paraspinal mass.   L1-L2: No significant disc herniation. No spinal canal stenosis or neural foraminal narrowing.   L2-L3: No significant disc herniation. No spinal canal stenosis or neural foraminal narrowing.   L3-L4: Minimal disc bulging resulting in mild bilateral lateral recess stenosis.   L4-L5: Mild diffuse disc bulging causing mild central spinal canal stenosis and mild bilateral lateral recess stenosis.   L5-S1: No significant disc herniation. No spinal canal stenosis or neural foraminal narrowing.   IMPRESSION: 1. Mild levoscoliosis of the lumbar spine. 2. Minimal disc bulging at L3-4 resulting in mild bilateral lateral recess stenosis.  3. Mild diffuse disc bulging at L4-5 causing mild central spinal canal stenosis and mild bilateral lateral recess stenosis.   Electronically signed by: Evalene Coho MD 09/17/2023 10:44 AM EDT RP Workstation: [id number]     I have personally reviewed the images and agree with the above interpretation.  Assessment and Plan: Ruth Rivers is a pleasant 48 y.o. female has ***  Treatment options discussed with patient and following plan made:   - Order for physical therapy for *** spine ***. Patient to call to schedule appointment. *** - Continue current medications including ***. Reviewed dosing and side effects.  - Prescription for ***. Reviewed dosing and side effects. Take with food.  - Prescription for *** to take prn muscle spasms. Reviewed dosing and side effects. Discussed this can cause drowsiness.  - MRI of *** to further evaluate *** radiculopathy. No improvement time or medications (***).  - Referral to PMR at [facility] to discuss possible *** injections.  - Will schedule phone visit to review MRI results once I get them back.   I spent a total of *** minutes in face-to-face and non-face-to-face activities related to this patient's care today including review of outside records, review of  imaging, review of symptoms, physical exam, discussion of differential diagnosis, discussion of treatment options, and documentation.   Thank you for involving me in the care of this patient.   Glade Boys PA-C Dept. of Neurosurgery

## 2023-10-17 ENCOUNTER — Encounter: Payer: Self-pay | Admitting: Orthopedic Surgery

## 2023-10-17 ENCOUNTER — Ambulatory Visit: Admitting: Orthopedic Surgery

## 2023-10-17 ENCOUNTER — Telehealth: Payer: Self-pay

## 2023-10-17 VITALS — BP 124/78 | Ht 62.0 in | Wt 279.0 lb

## 2023-10-17 DIAGNOSIS — M48061 Spinal stenosis, lumbar region without neurogenic claudication: Secondary | ICD-10-CM | POA: Diagnosis not present

## 2023-10-17 DIAGNOSIS — M4726 Other spondylosis with radiculopathy, lumbar region: Secondary | ICD-10-CM

## 2023-10-17 DIAGNOSIS — M5416 Radiculopathy, lumbar region: Secondary | ICD-10-CM

## 2023-10-17 DIAGNOSIS — M51362 Other intervertebral disc degeneration, lumbar region with discogenic back pain and lower extremity pain: Secondary | ICD-10-CM

## 2023-10-17 DIAGNOSIS — M47816 Spondylosis without myelopathy or radiculopathy, lumbar region: Secondary | ICD-10-CM

## 2023-10-17 MED ORDER — HYDROCODONE-ACETAMINOPHEN 5-325 MG PO TABS: 1.0000 | ORAL_TABLET | Freq: Two times a day (BID) | ORAL | 0 refills | Status: DC | PRN

## 2023-10-17 NOTE — Telephone Encounter (Signed)
 Authorization has been submitted through Cover My Meds and was sent to plan and is pending.  Key: [id number]

## 2023-10-17 NOTE — Patient Instructions (Signed)
 It was so nice to see you today. Thank you so much for coming in.    You have some wear and tear in your back and I think this is causing your back and leg pain.   I sent a prescription for hydrocodone  to take only as needed for severe pain. This can make you sleepy and constipated.   I want you to see physical medicine and rehab at the [facility] to discuss possible injections in your lower back. Dr. Avanell, Dr. Dodson, and their PA [place] are great and will take good care of you. They should call you to schedule an appointment or you can call them at [phone].   I gave you a work note. Let me know if you need anything else.   I will see you back in 6-8 weeks. Please do not hesitate to call if you have any questions or concerns. You can also message me in MyChart.   Glade Boys PA-C [phone]     The physicians and staff at [facility] at [facility] are committed to providing excellent care. You may receive a survey asking for feedback about your experience at our office. We value you your feedback and appreciate you taking the time to to fill it out. The [facility] leadership team is also available to discuss your experience in person, feel free to contact us  [phone].

## 2023-10-18 NOTE — Telephone Encounter (Signed)
 PA approved for dates 10/18/23-04/17/24, pharmacist at [facility] advised and I called patient and left a detailed message letting her know this information

## 2023-11-17 ENCOUNTER — Other Ambulatory Visit: Payer: Self-pay | Admitting: *Deleted

## 2023-11-17 DIAGNOSIS — M5442 Lumbago with sciatica, left side: Secondary | ICD-10-CM

## 2023-11-17 DIAGNOSIS — M51369 Other intervertebral disc degeneration, lumbar region without mention of lumbar back pain or lower extremity pain: Secondary | ICD-10-CM

## 2023-12-06 ENCOUNTER — Ambulatory Visit: Payer: Self-pay | Admitting: Family Medicine

## 2023-12-06 ENCOUNTER — Encounter: Payer: Self-pay | Admitting: Family Medicine

## 2023-12-06 VITALS — BP 130/86 | HR 73 | Temp 97.8°F | Ht 62.0 in | Wt 280.2 lb

## 2023-12-06 DIAGNOSIS — M5442 Lumbago with sciatica, left side: Secondary | ICD-10-CM | POA: Diagnosis not present

## 2023-12-06 DIAGNOSIS — G4709 Other insomnia: Secondary | ICD-10-CM

## 2023-12-06 DIAGNOSIS — E785 Hyperlipidemia, unspecified: Secondary | ICD-10-CM | POA: Diagnosis not present

## 2023-12-06 DIAGNOSIS — M51369 Other intervertebral disc degeneration, lumbar region without mention of lumbar back pain or lower extremity pain: Secondary | ICD-10-CM

## 2023-12-06 DIAGNOSIS — Z23 Encounter for immunization: Secondary | ICD-10-CM | POA: Diagnosis not present

## 2023-12-06 LAB — LIPID PANEL

## 2023-12-06 LAB — CBC WITH DIFF/PLATELET

## 2023-12-06 MED ORDER — ZOLPIDEM TARTRATE ER 6.25 MG PO TBCR: 6.2500 mg | EXTENDED_RELEASE_TABLET | Freq: Every evening | ORAL | 5 refills | Status: AC | PRN

## 2023-12-06 NOTE — Progress Notes (Unsigned)
 BP 130/86   Pulse 73   Temp 97.8 F (36.6 C)   Ht 5' 2 (1.575 m)   Wt 280 lb 3.2 oz (127.1 kg)   LMP 06/16/2018 Comment: states no way pregnant  SpO2 100%   BMI 51.25 kg/m    Subjective:   Patient ID: Ruth Rivers, female    DOB: Jun 13, 1975, 48 y.o.   MRN: [id number]  HPI: Ruth Rivers is a 48 y.o. female presenting on 12/06/2023 for Medical Management of Chronic Issues   Discussed the use of AI scribe software for clinical note transcription with the patient, who gave verbal consent to proceed.  History of Present Illness   Ruth Rivers is a 48 year old female who presents for a recheck of her medications and mental health management.  Insomnia - Intermittent sleep disturbances managed with Ambien  as needed - Ambien  is not taken daily - Occasionally falls asleep while listening to an audio book - Ambien  is effective in helping her sleep through the night when used  Sciatica - Previously prescribed gabapentin  for sciatica - No longer requires regular use of gabapentin  since receiving an injection that alleviated symptoms - Has an unopened bottle of gabapentin  and only uses it for severe symptoms, which have not occurred recently  Anxiety and mood symptoms - Under care of a psychiatrist with an upcoming appointment scheduled for Wednesday - Experiences significant anxiety - Current medication regimen includes Vraylar and Wellbutrin - Feels current medications may not be adequately managing symptoms - No dosage adjustments discussed yet with psychiatrist          Relevant past medical, surgical, family and social history reviewed and updated as indicated. Interim medical history since our last visit reviewed. Allergies and medications reviewed and updated.  Review of Systems  Constitutional:  Negative for chills and fever.  Eyes:  Negative for visual disturbance.  Respiratory:  Negative for chest tightness and shortness of breath.   Cardiovascular:  Negative for  chest pain and leg swelling.  Skin:  Negative for rash.  Neurological:  Negative for dizziness, light-headedness and headaches.  Psychiatric/Behavioral:  Positive for dysphoric mood and sleep disturbance. Negative for agitation, behavioral problems, self-injury and suicidal ideas. The patient is nervous/anxious.   All other systems reviewed and are negative.   Per HPI unless specifically indicated above   Allergies as of 12/06/2023       Reactions   Cephalosporins Itching, Rash   Penicillins Rash        Medication List        Accurate as of December 06, 2023 11:59 PM. If you have any questions, ask your nurse or doctor.          STOP taking these medications    HYDROcodone -acetaminophen  5-325 MG tablet Commonly known as: NORCO/VICODIN Stopped by: Fonda LABOR Mikael Debell       TAKE these medications    buPROPion 300 MG 24 hr tablet Commonly known as: WELLBUTRIN XL Take 300 mg by mouth daily.   calcium -vitamin D  500-200 MG-UNIT tablet Commonly known as: OSCAL WITH D Take 1 tablet by mouth daily.   celecoxib  200 MG capsule Commonly known as: CeleBREX  Take 1 capsule (200 mg total) by mouth 2 (two) times daily as needed for moderate pain (pain score 4-6) (no ibuprofen while taking).   clonazePAM 0.5 MG tablet Commonly known as: KLONOPIN Take 0.25-0.5 mg by mouth 2 (two) times daily as needed for anxiety.   gabapentin  300 MG capsule Commonly known as: NEURONTIN  TAKE  1 CAPSULE (300 MG TOTAL) BY MOUTH 2 (TWO) TIMES DAILY AS NEEDED (SCIATICA).   Multi-Vitamin tablet Take 1 tablet by mouth daily.   traZODone 100 MG tablet Commonly known as: DESYREL Take 200-300 mg by mouth at bedtime.   vortioxetine HBr 20 MG Tabs tablet Commonly known as: TRINTELLIX Take 20 mg by mouth daily.   Vraylar 6 MG Caps Generic drug: Cariprazine HCl Take 6 mg by mouth daily.   zolpidem  6.25 MG CR tablet Commonly known as: AMBIEN  CR Take 1-2 tablets (6.25-12.5 mg total) by mouth  at bedtime as needed for sleep. Cancel out any prior prescriptions on file         Objective:   BP 130/86   Pulse 73   Temp 97.8 F (36.6 C)   Ht 5' 2 (1.575 m)   Wt 280 lb 3.2 oz (127.1 kg)   LMP 06/16/2018 Comment: states no way pregnant  SpO2 100%   BMI 51.25 kg/m   Wt Readings from Last 3 Encounters:  12/06/23 280 lb 3.2 oz (127.1 kg)  10/17/23 279 lb (126.6 kg)  09/25/23 272 lb 6 oz (123.5 kg)    Physical Exam Vitals and nursing note reviewed.  Constitutional:      General: She is not in acute distress.    Appearance: She is well-developed. She is not diaphoretic.  Eyes:     Conjunctiva/sclera: Conjunctivae normal.  Cardiovascular:     Rate and Rhythm: Normal rate and regular rhythm.     Heart sounds: Normal heart sounds. No murmur heard. Pulmonary:     Effort: Pulmonary effort is normal. No respiratory distress.     Breath sounds: Normal breath sounds. No wheezing.  Musculoskeletal:        General: No swelling.  Skin:    General: Skin is warm and dry.     Findings: No rash.  Neurological:     Mental Status: She is alert and oriented to person, place, and time.     Coordination: Coordination normal.  Psychiatric:        Behavior: Behavior normal.    Physical Exam   CHEST: Lungs clear to auscultation bilaterally. CARDIOVASCULAR: Regular heart sounds.         Assessment & Plan:   Problem List Items Addressed This Visit       Other   Insomnia   Relevant Medications   zolpidem  (AMBIEN  CR) 6.25 MG CR tablet   Other Relevant Orders   CBC With Diff/Platelet (Completed)   CMP14+EGFR (Completed)   Lipid panel (Completed)   Dyslipidemia   Relevant Orders   CBC With Diff/Platelet (Completed)   CMP14+EGFR (Completed)   Lipid panel (Completed)   Other Visit Diagnoses       Immunization due    -  Primary   Relevant Orders   Varicella-zoster vaccine IM (Completed)   Pneumococcal conjugate vaccine 20-valent (Prevnar 20) (Completed)     Bulging of  lumbar intervertebral disc         Acute left-sided low back pain with left-sided sciatica       Relevant Medications   zolpidem  (AMBIEN  CR) 6.25 MG CR tablet          Insomnia Intermittent insomnia managed with Ambien  as needed. Reports good effect with occasional use. - Continue Ambien  as needed for insomnia. - Sent refill for Ambien .  Left-sided lumbago with sciatica Improvement noted with previous gabapentin  use. No significant symptoms since receiving a shot. - Use gabapentin  as needed for sciatica symptoms.  Hyperlipidemia Requested blood work to monitor cholesterol levels. - Ordered blood work to assess cholesterol levels.  General Health Maintenance Routine follow-up and monitoring of health conditions. - Scheduled follow-up appointment in six months.          Follow up plan: Return in about 6 months (around 06/04/2024), or if symptoms worsen or fail to improve, for Physical exam and recheck insomnia.  Counseling provided for all of the vaccine components Orders Placed This Encounter  Procedures   Varicella-zoster vaccine IM   Pneumococcal conjugate vaccine 20-valent (Prevnar 20)   CBC With Diff/Platelet   CMP14+EGFR   Lipid panel    Fonda Levins, MD [facility] 12/09/2023, 7:42 AM

## 2023-12-07 LAB — CBC WITH DIFF/PLATELET
Basos: 1 %
EOS (ABSOLUTE): 0.1 x10E3/uL (ref 0.0–0.2)
Eos: 1 %
Hematocrit: 42.9 % (ref 34.0–46.6)
Hemoglobin: 14 g/dL (ref 11.1–15.9)
Immature Granulocytes: 0 %
Immature Granulocytes: 0 x10E3/uL (ref 0.0–0.1)
Lymphs: 20 %
MCH: 30.6 pg (ref 26.6–33.0)
MCHC: 32.6 g/dL (ref 31.5–35.7)
MCV: 94 fL (ref 79–97)
Monocytes Absolute: 0.1 x10E3/uL (ref 0.0–0.4)
Monocytes Absolute: 0.8 x10E3/uL (ref 0.1–0.9)
Monocytes: 10 %
Neutrophils Absolute: 1.7 x10E3/uL (ref 0.7–3.1)
Neutrophils Absolute: 5.9 x10E3/uL (ref 1.4–7.0)
Neutrophils: 67 %
Platelets: 310 x10E3/uL (ref 150–450)
RBC: 4.57 x10E6/uL (ref 3.77–5.28)
RDW: 11.6 % — AB (ref 11.7–15.4)
WBC: 8.6 x10E3/uL (ref 3.4–10.8)

## 2023-12-07 LAB — CMP14+EGFR
ALT: 16 IU/L (ref 0–32)
AST: 22 IU/L (ref 0–40)
Albumin: 4.3 g/dL (ref 3.9–4.9)
Alkaline Phosphatase: 71 IU/L (ref 41–116)
BUN/Creatinine Ratio: 11 (ref 9–23)
BUN: 9 mg/dL (ref 6–24)
Bilirubin Total: 0.3 mg/dL (ref 0.0–1.2)
CO2: 25 mmol/L (ref 20–29)
Calcium: 9.1 mg/dL (ref 8.7–10.2)
Chloride: 101 mmol/L (ref 96–106)
Creatinine, Ser: 0.8 mg/dL (ref 0.57–1.00)
Globulin, Total: 2.5 g/dL (ref 1.5–4.5)
Glucose: 83 mg/dL (ref 70–99)
Potassium: 4.6 mmol/L (ref 3.5–5.2)
Sodium: 141 mmol/L (ref 134–144)
Total Protein: 6.8 g/dL (ref 6.0–8.5)
eGFR: 90 mL/min/1.73 (ref >59)

## 2023-12-07 LAB — LIPID PANEL
Cholesterol, Total: 175 mg/dL (ref 100–199)
HDL: 65 mg/dL (ref >39)
LDL CALC COMMENT:: 2.7 ratio (ref 0.0–4.4)
LDL Chol Calc (NIH): 94 mg/dL (ref 0–99)
Triglycerides: 87 mg/dL (ref 0–149)
VLDL Cholesterol Cal: 16 mg/dL (ref 5–40)

## 2023-12-09 NOTE — Progress Notes (Deleted)
 Referring Physician:  Dettinger, Fonda LABOR, MD [address] [place],  [place] [address]  Primary Physician:  Dettinger, Fonda LABOR, MD  History of Present Illness: 12/09/2023 Ruth Rivers has a history of gastritis, PUD, anxiety, depression, obesity, dyslipidemia.   Last seen by me on 10/17/23 for left sided LBP and left leg pain. She has known  lumbar spondylosis with mild lateral recess stenosis L3-L4 and mild bilateral foraminal stenosis L4-L5. I think she also has some left foraminal stenosis at L5-S1.   She declined PT. She was sent to PMR for injections and had left L5-S1 TF ESI on 11/05/23.   She had great relief with her lumbar ESI - at follow up with Whitney on 11/27/23- she said she was 99% better.       She has 4 week history of constant left sided LBP with radiation to left buttocks and left posterior leg to her foot. She has numbness, tingling, and weakness in left leg. She had recent fall due to left leg giving way. No right leg pain. Pain is worse with any prolonged position. Some relief with walking.   She is taking celebrex , neurontin .   Had previous relief with lumbar injections.   Tobacco use: Does not smoke.   Bowel/Bladder Dysfunction: none  Conservative measures: HEP Physical therapy:  has not participated in Multimodal medical therapy including regular antiinflammatories:  Prednisone , Motrin, Percocet, Celebrex , Gabapentin , Robaxin  Injections:  11/05/23: left L5-S1 TF ESI  06/19/2022- ESI Left L5-S1  10/19/2021--ESI left L4-5  10/02/2017- ESI Left L3-L4 11/11/2017- ESI Left L4-L5  Past Surgery: no spine surgery  [name] has no symptoms of cervical myelopathy.  The symptoms are causing a significant impact on the patient's life.   Review of Systems:  A 10 point review of systems is negative, except for the pertinent positives and negatives detailed in the HPI.  Past Medical History: Past Medical History:  Diagnosis Date   Adenomatous colon  polyp    Allergy    seasonal   Anxiety    Cellulitis    Cough    had a cough for 2 weeks/ in isolation for over 2 weeks/ no fever/ in March of 2020   Depression    Gastritis    GERD (gastroesophageal reflux disease)    History of herniated intervertebral disc    Insomnia    Obesity    Post-operative nausea and vomiting     Past Surgical History: Past Surgical History:  Procedure Laterality Date   BREAST REDUCTION SURGERY  2001   CESAREAN SECTION     one time   CHOLECYSTECTOMY  10/1998   COLONOSCOPY  2013   COLONOSCOPY W/ BIOPSIES     POLYPECTOMY  2013   TONSILLECTOMY      Allergies: Allergies as of 12/18/2023 - Review Complete 12/06/2023  Allergen Reaction Noted   Cephalosporins Itching and Rash 02/03/2015   Penicillins Rash 10/02/2011    Medications: Outpatient Encounter Medications as of 12/18/2023  Medication Sig   buPROPion (WELLBUTRIN XL) 300 MG 24 hr tablet Take 300 mg by mouth daily.   calcium -vitamin D  (OSCAL WITH D) 500-200 MG-UNIT tablet Take 1 tablet by mouth daily.   celecoxib  (CELEBREX ) 200 MG capsule Take 1 capsule (200 mg total) by mouth 2 (two) times daily as needed for moderate pain (pain score 4-6) (no ibuprofen while taking).   clonazePAM (KLONOPIN) 0.5 MG tablet Take 0.25-0.5 mg by mouth 2 (two) times daily as needed for anxiety.   gabapentin  (  NEURONTIN ) 300 MG capsule TAKE 1 CAPSULE (300 MG TOTAL) BY MOUTH 2 (TWO) TIMES DAILY AS NEEDED (SCIATICA).   Multiple Vitamin (MULTI-VITAMIN) tablet Take 1 tablet by mouth daily.   traZODone (DESYREL) 100 MG tablet Take 200-300 mg by mouth at bedtime.   vortioxetine HBr (TRINTELLIX) 20 MG TABS tablet Take 20 mg by mouth daily.   VRAYLAR 6 MG CAPS Take 6 mg by mouth daily.   zolpidem  (AMBIEN  CR) 6.25 MG CR tablet Take 1-2 tablets (6.25-12.5 mg total) by mouth at bedtime as needed for sleep. Cancel out any prior prescriptions on file   No facility-administered encounter medications on file as of 12/18/2023.     Social History: Social History   Tobacco Use   Smoking status: Never   Smokeless tobacco: Never  Vaping Use   Vaping status: Never Used  Substance Use Topics   Alcohol use: Not Currently   Drug use: No    Family Medical History: Family History  Problem Relation Age of Onset   Colon polyps Mother    Colon polyps Father    Breast cancer Maternal Grandmother    Colon cancer Maternal Aunt        dx 50's died within 1 year   Other Cousin        Non-Hodgkins lymphoma   Breast cancer Other        PGGM   Autism Son    Schizophrenia Son    Esophageal cancer Neg Hx    Rectal cancer Neg Hx    Stomach cancer Neg Hx    Pancreatic cancer Neg Hx     Physical Examination: There were no vitals filed for this visit.    Awake, alert, oriented to person, place, and time.  Speech is clear and fluent. Fund of knowledge is appropriate.   Cranial Nerves: Pupils equal round and reactive to light.  Facial tone is symmetric.    She is obviously uncomfortable in the exam room due to pain.   Diffuse left lower posterior lumbar tenderness.   No abnormal lesions on exposed skin.   Strength: Side Biceps Triceps Deltoid Interossei Grip Wrist Ext. Wrist Flex.  R 5 5 5 5 5 5 5   L 5 5 5 5 5 5 5    Side Iliopsoas Quads Hamstring PF DF EHL  R 5 5 5 5 5 5   L 5 5 5 5 5 5    Reflexes are 2+ and symmetric at the biceps, brachioradialis, patella and achilles.   Hoffman's is absent.  Clonus is not present.   Bilateral upper and lower extremity sensation is intact to light touch.     No pain with IR/ER of both hips.   Gait is normal.     Medical Decision Making  Imaging: none  Assessment and Plan: [name] has a 4 week history of constant left sided LBP with radiation to left buttocks and left posterior leg to her foot. She has numbness, tingling, and weakness in left leg. No right leg pain.   She has lumbar spondylosis with mild lateral recess stenosis L3-L4 and mild bilateral  foraminal stenosis L4-L5. I think she also has some left foraminal stenosis at L5-S1.   Treatment options discussed with patient and following plan made:   - Referral to [facility]R at [facility] to discuss injections. Message sent to try to get her seen quickly.  - Recommend PT for lumbar spine. She declines for now.  - One time only prescription for norco to take for severe  pain. Reviewed dosing and side effects. PMP is reviewed and appropriate.  - Follow up with me in 6-8 weeks and prn.   I spent a total of 45 minutes in face-to-face and non-face-to-face activities related to this patient's care today including review of outside records, review of imaging, review of symptoms, physical exam, discussion of differential diagnosis, discussion of treatment options, and documentation.   Thank you for involving me in the care of this patient.   Glade Boys PA-C Dept. of Neurosurgery

## 2023-12-11 ENCOUNTER — Ambulatory Visit: Payer: Self-pay | Admitting: Family Medicine

## 2023-12-18 ENCOUNTER — Ambulatory Visit: Admitting: Orthopedic Surgery

## 2023-12-25 ENCOUNTER — Ambulatory Visit: Admitting: Family Medicine

## 2023-12-30 ENCOUNTER — Other Ambulatory Visit: Payer: Self-pay | Admitting: Family Medicine

## 2023-12-30 DIAGNOSIS — M5442 Lumbago with sciatica, left side: Secondary | ICD-10-CM

## 2023-12-30 DIAGNOSIS — M51369 Other intervertebral disc degeneration, lumbar region without mention of lumbar back pain or lower extremity pain: Secondary | ICD-10-CM
